# Patient Record
Sex: Female | Born: 1950
Health system: Southern US, Community
[De-identification: ages and names within clinical notes are randomized; demographics above are authoritative.]

## PROBLEM LIST (undated history)

## (undated) DIAGNOSIS — N390 Urinary tract infection, site not specified: Secondary | ICD-10-CM

## (undated) DIAGNOSIS — R519 Headache, unspecified: Secondary | ICD-10-CM

## (undated) DIAGNOSIS — I10 Essential (primary) hypertension: Secondary | ICD-10-CM

## (undated) DIAGNOSIS — E785 Hyperlipidemia, unspecified: Secondary | ICD-10-CM

## (undated) DIAGNOSIS — G8929 Other chronic pain: Secondary | ICD-10-CM

## (undated) DIAGNOSIS — E669 Obesity, unspecified: Secondary | ICD-10-CM

## (undated) DIAGNOSIS — E079 Disorder of thyroid, unspecified: Secondary | ICD-10-CM

## (undated) HISTORY — DX: Urinary tract infection, site not specified: N39.0

## (undated) HISTORY — DX: Essential (primary) hypertension: I10

## (undated) HISTORY — DX: Hyperlipidemia, unspecified: E78.5

## (undated) HISTORY — DX: Headache, unspecified: R51.9

## (undated) HISTORY — DX: Other chronic pain: G89.29

## (undated) HISTORY — DX: Disorder of thyroid, unspecified: E07.9

## (undated) HISTORY — PX: CHOLECYSTECTOMY: SHX55

## (undated) HISTORY — DX: Obesity, unspecified: E66.9

---

## 2002-11-13 ENCOUNTER — Encounter: Admission: RE | Admit: 2002-11-13 | Discharge: 2002-11-13 | Payer: Self-pay | Admitting: Obstetrics & Gynecology

## 2002-11-13 ENCOUNTER — Encounter: Payer: Self-pay | Admitting: Obstetrics & Gynecology

## 2003-01-17 ENCOUNTER — Other Ambulatory Visit: Admission: RE | Admit: 2003-01-17 | Discharge: 2003-01-17 | Payer: Self-pay | Admitting: Obstetrics & Gynecology

## 2005-10-13 ENCOUNTER — Encounter: Admission: RE | Admit: 2005-10-13 | Discharge: 2005-10-13 | Payer: Self-pay | Admitting: Obstetrics & Gynecology

## 2014-01-02 ENCOUNTER — Other Ambulatory Visit: Payer: Self-pay

## 2014-01-02 DIAGNOSIS — Z1231 Encounter for screening mammogram for malignant neoplasm of breast: Secondary | ICD-10-CM

## 2014-01-17 ENCOUNTER — Encounter (INDEPENDENT_AMBULATORY_CARE_PROVIDER_SITE_OTHER): Payer: Self-pay

## 2014-01-17 ENCOUNTER — Ambulatory Visit
Admission: RE | Admit: 2014-01-17 | Discharge: 2014-01-17 | Disposition: A | Discharge status: Home / Self Care | Payer: BC Managed Care – PPO | Source: Ambulatory Visit

## 2014-01-17 DIAGNOSIS — Z1231 Encounter for screening mammogram for malignant neoplasm of breast: Secondary | ICD-10-CM

## 2014-08-23 DIAGNOSIS — R32 Unspecified urinary incontinence: Secondary | ICD-10-CM | POA: Insufficient documentation

## 2014-08-31 DIAGNOSIS — D169 Benign neoplasm of bone and articular cartilage, unspecified: Secondary | ICD-10-CM | POA: Insufficient documentation

## 2015-01-31 ENCOUNTER — Other Ambulatory Visit: Payer: Self-pay

## 2015-01-31 DIAGNOSIS — Z1231 Encounter for screening mammogram for malignant neoplasm of breast: Secondary | ICD-10-CM

## 2015-02-27 ENCOUNTER — Ambulatory Visit
Admission: RE | Admit: 2015-02-27 | Discharge: 2015-02-27 | Disposition: A | Discharge status: Home / Self Care | Payer: BLUE CROSS/BLUE SHIELD | Source: Ambulatory Visit

## 2015-02-27 DIAGNOSIS — Z1231 Encounter for screening mammogram for malignant neoplasm of breast: Secondary | ICD-10-CM

## 2016-01-29 ENCOUNTER — Other Ambulatory Visit: Payer: Self-pay

## 2016-01-29 ENCOUNTER — Other Ambulatory Visit: Payer: Self-pay | Admitting: Family Medicine

## 2016-01-29 DIAGNOSIS — Z1231 Encounter for screening mammogram for malignant neoplasm of breast: Secondary | ICD-10-CM

## 2016-02-07 DIAGNOSIS — E66811 Obesity, class 1: Secondary | ICD-10-CM | POA: Insufficient documentation

## 2016-03-02 ENCOUNTER — Ambulatory Visit
Admission: RE | Admit: 2016-03-02 | Discharge: 2016-03-02 | Disposition: A | Discharge status: Home / Self Care | Payer: BLUE CROSS/BLUE SHIELD | Source: Ambulatory Visit

## 2016-03-02 DIAGNOSIS — Z1231 Encounter for screening mammogram for malignant neoplasm of breast: Secondary | ICD-10-CM

## 2017-03-05 ENCOUNTER — Other Ambulatory Visit: Payer: Self-pay | Admitting: Family Medicine

## 2017-03-05 DIAGNOSIS — Z1231 Encounter for screening mammogram for malignant neoplasm of breast: Secondary | ICD-10-CM

## 2017-04-06 ENCOUNTER — Ambulatory Visit: Payer: BLUE CROSS/BLUE SHIELD

## 2017-04-08 ENCOUNTER — Ambulatory Visit
Admission: RE | Admit: 2017-04-08 | Discharge: 2017-04-08 | Disposition: A | Discharge status: Home / Self Care | Payer: BLUE CROSS/BLUE SHIELD | Source: Ambulatory Visit | Attending: Family Medicine | Admitting: Family Medicine

## 2017-04-08 DIAGNOSIS — Z1231 Encounter for screening mammogram for malignant neoplasm of breast: Secondary | ICD-10-CM

## 2018-02-23 ENCOUNTER — Other Ambulatory Visit: Payer: Self-pay | Admitting: Family Medicine

## 2018-02-23 DIAGNOSIS — Z1231 Encounter for screening mammogram for malignant neoplasm of breast: Secondary | ICD-10-CM

## 2018-04-11 ENCOUNTER — Ambulatory Visit
Admission: RE | Admit: 2018-04-11 | Discharge: 2018-04-11 | Disposition: A | Discharge status: Home / Self Care | Payer: BLUE CROSS/BLUE SHIELD | Source: Ambulatory Visit | Attending: Family Medicine | Admitting: Family Medicine

## 2018-04-11 DIAGNOSIS — Z1231 Encounter for screening mammogram for malignant neoplasm of breast: Secondary | ICD-10-CM

## 2018-08-04 DIAGNOSIS — H25813 Combined forms of age-related cataract, bilateral: Secondary | ICD-10-CM | POA: Diagnosis not present

## 2018-08-04 DIAGNOSIS — H35372 Puckering of macula, left eye: Secondary | ICD-10-CM | POA: Diagnosis not present

## 2018-09-30 DIAGNOSIS — E039 Hypothyroidism, unspecified: Secondary | ICD-10-CM | POA: Diagnosis not present

## 2018-09-30 DIAGNOSIS — E669 Obesity, unspecified: Secondary | ICD-10-CM | POA: Diagnosis not present

## 2018-09-30 DIAGNOSIS — E663 Overweight: Secondary | ICD-10-CM | POA: Diagnosis not present

## 2018-10-20 DIAGNOSIS — E669 Obesity, unspecified: Secondary | ICD-10-CM | POA: Diagnosis not present

## 2018-10-20 DIAGNOSIS — I10 Essential (primary) hypertension: Secondary | ICD-10-CM | POA: Diagnosis not present

## 2018-10-20 DIAGNOSIS — Z6830 Body mass index (BMI) 30.0-30.9, adult: Secondary | ICD-10-CM | POA: Diagnosis not present

## 2019-03-31 ENCOUNTER — Other Ambulatory Visit: Payer: Self-pay | Admitting: *Deleted

## 2019-03-31 DIAGNOSIS — E6609 Other obesity due to excess calories: Secondary | ICD-10-CM | POA: Diagnosis not present

## 2019-03-31 DIAGNOSIS — Z1231 Encounter for screening mammogram for malignant neoplasm of breast: Secondary | ICD-10-CM

## 2019-03-31 DIAGNOSIS — R030 Elevated blood-pressure reading, without diagnosis of hypertension: Secondary | ICD-10-CM | POA: Diagnosis not present

## 2019-03-31 DIAGNOSIS — Z6832 Body mass index (BMI) 32.0-32.9, adult: Secondary | ICD-10-CM | POA: Diagnosis not present

## 2019-03-31 DIAGNOSIS — E039 Hypothyroidism, unspecified: Secondary | ICD-10-CM | POA: Diagnosis not present

## 2019-04-24 ENCOUNTER — Ambulatory Visit
Admission: RE | Admit: 2019-04-24 | Discharge: 2019-04-24 | Disposition: A | Discharge status: Home / Self Care | Payer: BC Managed Care – PPO | Source: Ambulatory Visit

## 2019-04-24 ENCOUNTER — Other Ambulatory Visit: Payer: Self-pay | Admitting: *Deleted

## 2019-04-24 ENCOUNTER — Other Ambulatory Visit: Payer: Self-pay

## 2019-04-24 DIAGNOSIS — Z1231 Encounter for screening mammogram for malignant neoplasm of breast: Secondary | ICD-10-CM

## 2019-05-04 DIAGNOSIS — D1801 Hemangioma of skin and subcutaneous tissue: Secondary | ICD-10-CM | POA: Diagnosis not present

## 2019-05-04 DIAGNOSIS — L814 Other melanin hyperpigmentation: Secondary | ICD-10-CM | POA: Diagnosis not present

## 2019-05-04 DIAGNOSIS — D225 Melanocytic nevi of trunk: Secondary | ICD-10-CM | POA: Diagnosis not present

## 2019-05-04 DIAGNOSIS — L821 Other seborrheic keratosis: Secondary | ICD-10-CM | POA: Diagnosis not present

## 2019-05-09 DIAGNOSIS — H5213 Myopia, bilateral: Secondary | ICD-10-CM | POA: Diagnosis not present

## 2019-06-15 DIAGNOSIS — Z01812 Encounter for preprocedural laboratory examination: Secondary | ICD-10-CM | POA: Diagnosis not present

## 2019-06-15 DIAGNOSIS — Z23 Encounter for immunization: Secondary | ICD-10-CM | POA: Diagnosis not present

## 2019-07-25 DIAGNOSIS — Z20828 Contact with and (suspected) exposure to other viral communicable diseases: Secondary | ICD-10-CM | POA: Diagnosis not present

## 2019-08-02 DIAGNOSIS — Z20828 Contact with and (suspected) exposure to other viral communicable diseases: Secondary | ICD-10-CM | POA: Diagnosis not present

## 2019-08-02 DIAGNOSIS — R519 Headache, unspecified: Secondary | ICD-10-CM | POA: Diagnosis not present

## 2019-08-02 DIAGNOSIS — M791 Myalgia, unspecified site: Secondary | ICD-10-CM | POA: Diagnosis not present

## 2019-08-03 DIAGNOSIS — J069 Acute upper respiratory infection, unspecified: Secondary | ICD-10-CM | POA: Diagnosis not present

## 2019-11-29 DIAGNOSIS — R030 Elevated blood-pressure reading, without diagnosis of hypertension: Secondary | ICD-10-CM | POA: Diagnosis not present

## 2019-11-29 DIAGNOSIS — E039 Hypothyroidism, unspecified: Secondary | ICD-10-CM | POA: Diagnosis not present

## 2019-11-29 DIAGNOSIS — Z6832 Body mass index (BMI) 32.0-32.9, adult: Secondary | ICD-10-CM | POA: Diagnosis not present

## 2019-11-29 DIAGNOSIS — E6609 Other obesity due to excess calories: Secondary | ICD-10-CM | POA: Diagnosis not present

## 2019-12-16 IMAGING — MG MM DIGITAL SCREENING BILAT W/ TOMO W/ CAD
8 series · 8 of 24 positions shown · non-contrast
Comparison: Previous exam(s).

CLINICAL DATA: Screening.

EXAM:
DIGITAL SCREENING BILATERAL MAMMOGRAM WITH TOMO AND CAD

[L CC synth-2D]
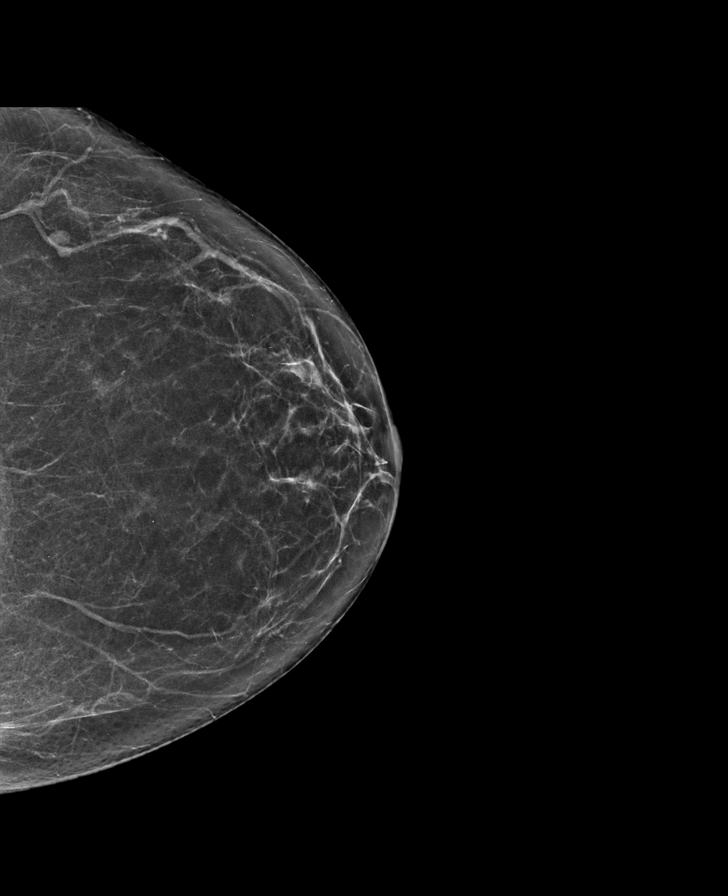

[R MLO synth-2D]
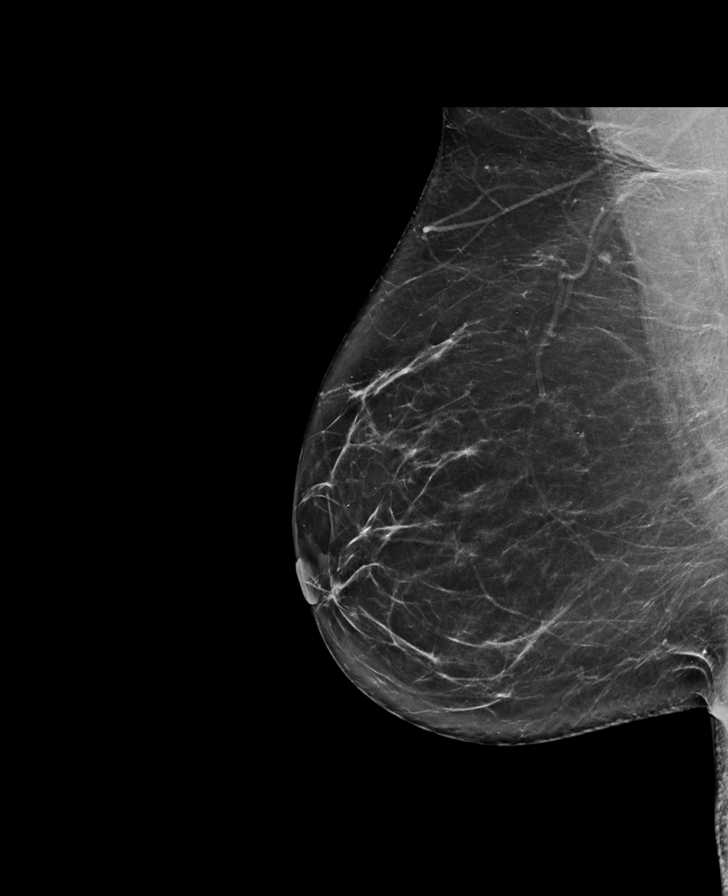

[L MLO synth-2D]
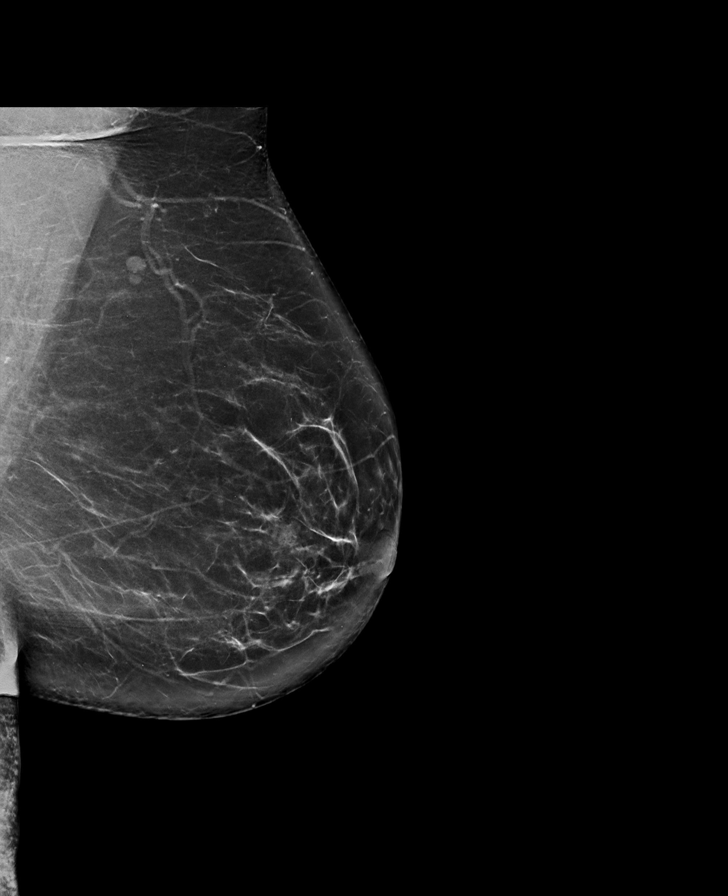

[R CC synth-2D]
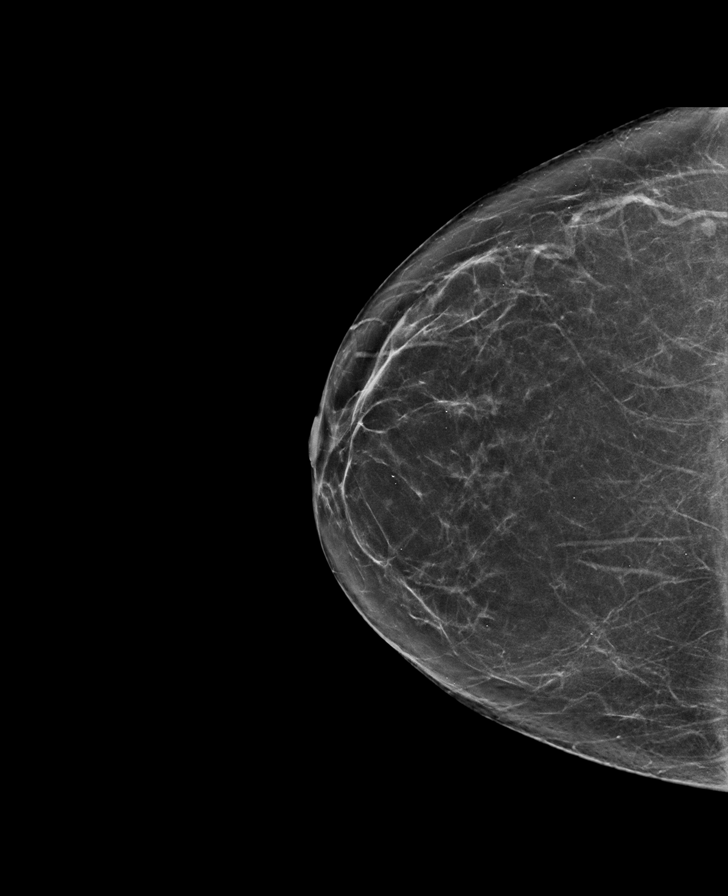

[R MLO tomo · tomo slice 41/81.0]
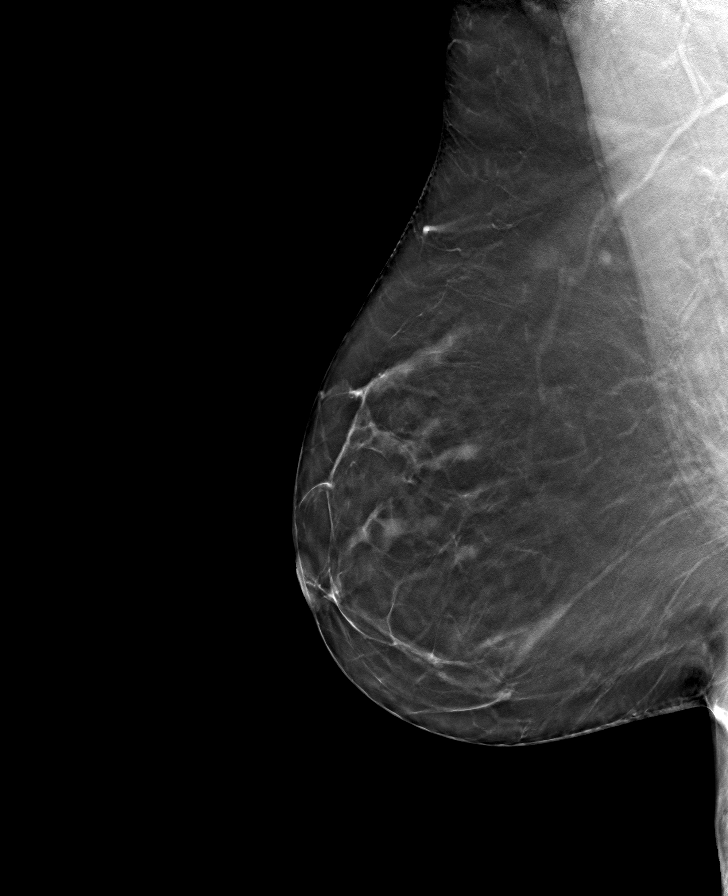

[R CC tomo · tomo slice 35/70.0]
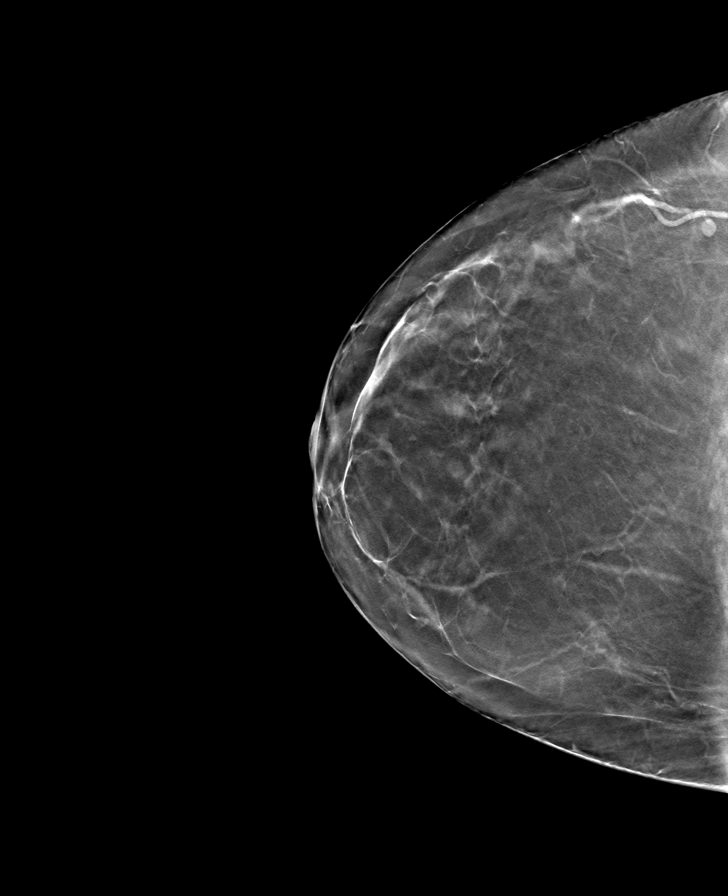

[L CC tomo · tomo slice 33/66.0]
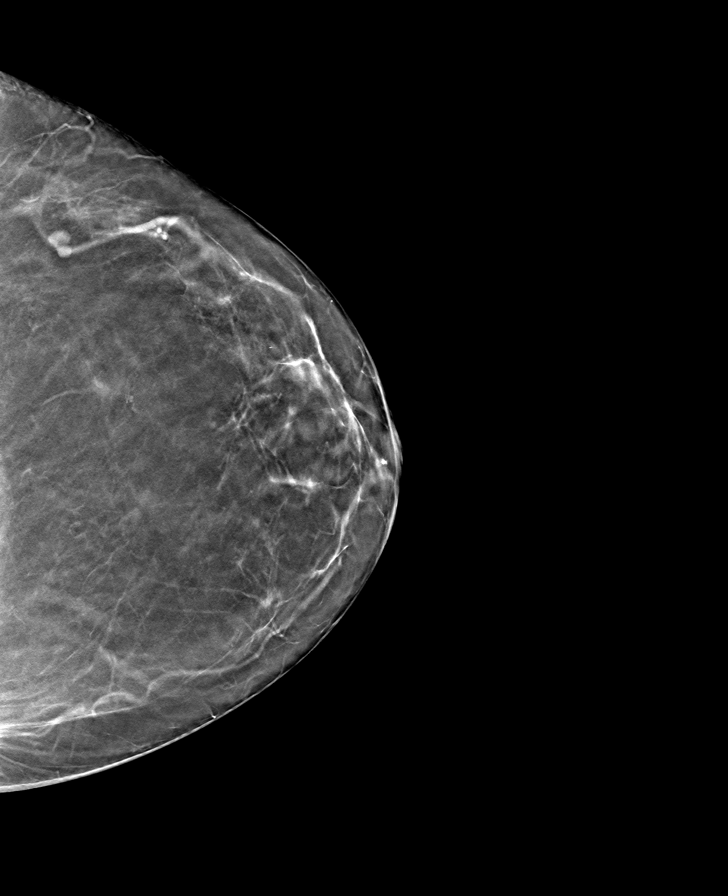

[L MLO tomo · tomo slice 43/84.0]
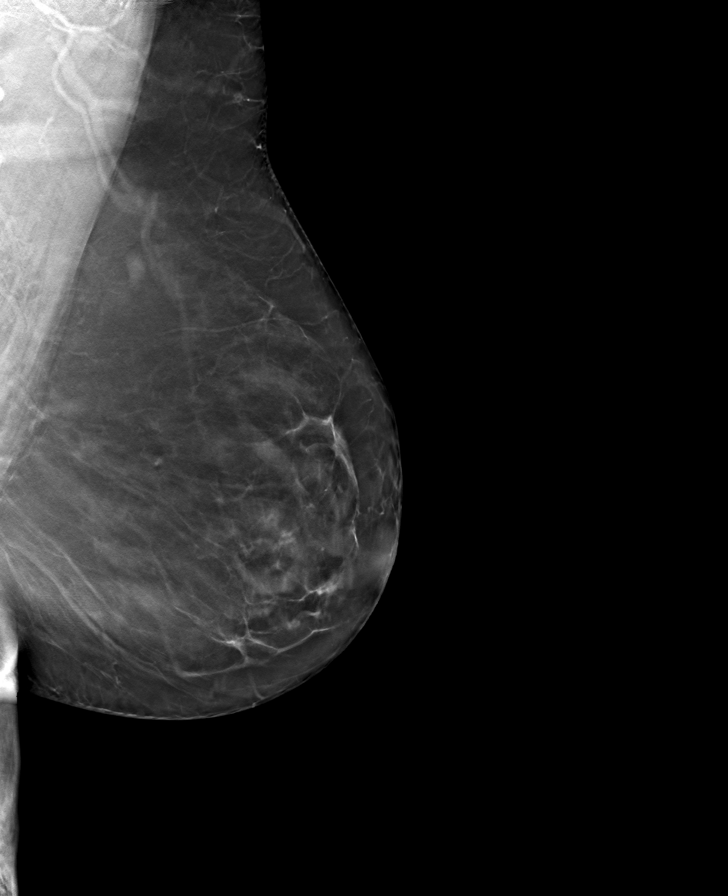

[8 of 24 positions shown; findings below may reference images not displayed]

ACR Breast Density Category b: There are scattered areas of
fibroglandular density.
FINDINGS: There are no findings suspicious for malignancy. Images were
processed with CAD.
IMPRESSION: No mammographic evidence of malignancy. A result letter of this
screening mammogram will be mailed directly to the patient.

RECOMMENDATION:
Screening mammogram in one year. (Code:[id number])

BI-RADS CATEGORY  1: Negative.

## 2019-12-25 DIAGNOSIS — H35372 Puckering of macula, left eye: Secondary | ICD-10-CM | POA: Diagnosis not present

## 2019-12-25 DIAGNOSIS — H25813 Combined forms of age-related cataract, bilateral: Secondary | ICD-10-CM | POA: Diagnosis not present

## 2020-01-02 DIAGNOSIS — H0288A Meibomian gland dysfunction right eye, upper and lower eyelids: Secondary | ICD-10-CM | POA: Diagnosis not present

## 2020-02-06 ENCOUNTER — Other Ambulatory Visit: Payer: Self-pay

## 2020-02-06 ENCOUNTER — Ambulatory Visit (INDEPENDENT_AMBULATORY_CARE_PROVIDER_SITE_OTHER): Payer: BC Managed Care – PPO

## 2020-02-06 DIAGNOSIS — Z111 Encounter for screening for respiratory tuberculosis: Secondary | ICD-10-CM

## 2020-02-08 ENCOUNTER — Other Ambulatory Visit: Payer: Self-pay

## 2020-02-08 ENCOUNTER — Ambulatory Visit: Payer: BC Managed Care – PPO

## 2020-02-08 DIAGNOSIS — Z111 Encounter for screening for respiratory tuberculosis: Secondary | ICD-10-CM

## 2020-02-08 DIAGNOSIS — Z23 Encounter for immunization: Secondary | ICD-10-CM

## 2020-02-08 LAB — TB SKIN TEST
Induration: 0 mm
TB Skin Test: NEGATIVE

## 2020-02-08 NOTE — Progress Notes (Signed)
Patient came in on 02/06/2020 for PPD placement

## 2020-02-09 NOTE — Progress Notes (Signed)
Patient came in for PPD reading on 02/08/2020.

## 2020-02-09 NOTE — Progress Notes (Signed)
° ° °  °  Mrs. Melanie Guerrero requested the BCG Vaccine.  She has read the information including potential side effects and signed the consent form.  She denies having any questions or concerns.    Vaccine was constituted using sterile water and administered on the left deltoid using a multiple puncture device.  Patient tolerated well.   Shelle Iron, LPN 80/16/55 37:48 AM

## 2020-02-09 NOTE — Addendum Note (Signed)
Addended by: Erie Noe on: 02/09/2020 10:52 AM   Modules accepted: Orders

## 2020-02-16 DIAGNOSIS — H25811 Combined forms of age-related cataract, right eye: Secondary | ICD-10-CM | POA: Diagnosis not present

## 2020-03-06 DIAGNOSIS — H52201 Unspecified astigmatism, right eye: Secondary | ICD-10-CM | POA: Diagnosis not present

## 2020-03-06 DIAGNOSIS — H2511 Age-related nuclear cataract, right eye: Secondary | ICD-10-CM | POA: Diagnosis not present

## 2020-03-06 DIAGNOSIS — H25811 Combined forms of age-related cataract, right eye: Secondary | ICD-10-CM | POA: Diagnosis not present

## 2020-03-06 DIAGNOSIS — H52221 Regular astigmatism, right eye: Secondary | ICD-10-CM | POA: Diagnosis not present

## 2020-03-14 DIAGNOSIS — H25812 Combined forms of age-related cataract, left eye: Secondary | ICD-10-CM | POA: Diagnosis not present

## 2020-03-14 DIAGNOSIS — H2512 Age-related nuclear cataract, left eye: Secondary | ICD-10-CM | POA: Diagnosis not present

## 2020-03-18 DIAGNOSIS — E6609 Other obesity due to excess calories: Secondary | ICD-10-CM | POA: Diagnosis not present

## 2020-03-18 DIAGNOSIS — E039 Hypothyroidism, unspecified: Secondary | ICD-10-CM | POA: Diagnosis not present

## 2020-03-18 DIAGNOSIS — Z6832 Body mass index (BMI) 32.0-32.9, adult: Secondary | ICD-10-CM | POA: Diagnosis not present

## 2020-03-18 DIAGNOSIS — R030 Elevated blood-pressure reading, without diagnosis of hypertension: Secondary | ICD-10-CM | POA: Diagnosis not present

## 2020-04-09 DIAGNOSIS — J101 Influenza due to other identified influenza virus with other respiratory manifestations: Secondary | ICD-10-CM | POA: Diagnosis not present

## 2020-04-09 DIAGNOSIS — B974 Respiratory syncytial virus as the cause of diseases classified elsewhere: Secondary | ICD-10-CM | POA: Diagnosis not present

## 2020-04-09 DIAGNOSIS — U071 COVID-19: Secondary | ICD-10-CM | POA: Diagnosis not present

## 2020-04-09 DIAGNOSIS — Z20828 Contact with and (suspected) exposure to other viral communicable diseases: Secondary | ICD-10-CM | POA: Diagnosis not present

## 2020-04-18 DIAGNOSIS — B974 Respiratory syncytial virus as the cause of diseases classified elsewhere: Secondary | ICD-10-CM | POA: Diagnosis not present

## 2020-04-18 DIAGNOSIS — U071 COVID-19: Secondary | ICD-10-CM | POA: Diagnosis not present

## 2020-04-18 DIAGNOSIS — Z20828 Contact with and (suspected) exposure to other viral communicable diseases: Secondary | ICD-10-CM | POA: Diagnosis not present

## 2020-04-18 DIAGNOSIS — J101 Influenza due to other identified influenza virus with other respiratory manifestations: Secondary | ICD-10-CM | POA: Diagnosis not present

## 2020-04-24 ENCOUNTER — Other Ambulatory Visit: Payer: Self-pay

## 2020-04-24 ENCOUNTER — Ambulatory Visit
Admission: RE | Admit: 2020-04-24 | Discharge: 2020-04-24 | Disposition: A | Discharge status: Home / Self Care | Payer: BC Managed Care – PPO | Source: Ambulatory Visit | Attending: Family Medicine | Admitting: Family Medicine

## 2020-04-24 DIAGNOSIS — Z1231 Encounter for screening mammogram for malignant neoplasm of breast: Secondary | ICD-10-CM | POA: Diagnosis not present

## 2020-05-07 DIAGNOSIS — D692 Other nonthrombocytopenic purpura: Secondary | ICD-10-CM | POA: Diagnosis not present

## 2020-05-07 DIAGNOSIS — L821 Other seborrheic keratosis: Secondary | ICD-10-CM | POA: Diagnosis not present

## 2020-05-07 DIAGNOSIS — L814 Other melanin hyperpigmentation: Secondary | ICD-10-CM | POA: Diagnosis not present

## 2020-05-07 DIAGNOSIS — D225 Melanocytic nevi of trunk: Secondary | ICD-10-CM | POA: Diagnosis not present

## 2020-06-05 DIAGNOSIS — H35373 Puckering of macula, bilateral: Secondary | ICD-10-CM | POA: Diagnosis not present

## 2020-09-13 ENCOUNTER — Other Ambulatory Visit: Payer: Self-pay

## 2020-09-13 ENCOUNTER — Other Ambulatory Visit: Payer: Self-pay | Admitting: Family Medicine

## 2020-09-13 ENCOUNTER — Encounter: Payer: Self-pay | Admitting: Family Medicine

## 2020-09-13 ENCOUNTER — Ambulatory Visit (INDEPENDENT_AMBULATORY_CARE_PROVIDER_SITE_OTHER): Payer: Medicare Other | Admitting: Family Medicine

## 2020-09-13 VITALS — BP 160/80 | HR 103 | Temp 98.7°F | Wt 205.0 lb

## 2020-09-13 DIAGNOSIS — R1013 Epigastric pain: Secondary | ICD-10-CM | POA: Diagnosis not present

## 2020-09-13 DIAGNOSIS — R10816 Epigastric abdominal tenderness: Secondary | ICD-10-CM

## 2020-09-13 DIAGNOSIS — I10 Essential (primary) hypertension: Secondary | ICD-10-CM | POA: Diagnosis not present

## 2020-09-13 DIAGNOSIS — Z1211 Encounter for screening for malignant neoplasm of colon: Secondary | ICD-10-CM

## 2020-09-13 MED ORDER — LOSARTAN POTASSIUM-HCTZ 50-12.5 MG PO TABS: 1.0000 | ORAL_TABLET | Freq: Every day | ORAL | 0 refills | Status: DC

## 2020-09-13 MED ORDER — OMEPRAZOLE 40 MG PO CPDR: 40.0000 mg | DELAYED_RELEASE_CAPSULE | Freq: Two times a day (BID) | ORAL | 0 refills | Status: DC

## 2020-09-13 NOTE — Progress Notes (Signed)
Acute Office Visit  Subjective:    Patient ID: Melanie Guerrero, female    DOB: [birth year], 70 y.o.   MRN: [id number]  Chief Complaint  Patient presents with  . Abdominal Pain  . Hypertension    HPI Patient is in today for epigastric and right upper quadrant abdominal pain that began earlier this morning.  It does not awaken her from sleep.  Radiates up to left shoulder. No associated sob,n,v. Pain waxes and wanes. The patient is not nauseated, vomiting, having diarrhea, or having constipation.  She denies fevers, chills or sweats.  She did take her blood pressure while I was on the phone with her initially and her blood pressure was 180/116, pulse 79.  Patient has a history of hypertension however she has been to control it with diet and exercise.  She has not actually checked her blood pressure for 3 months but prior to this it was ranging in the 120s over 80s she reports.  She denies any musculoskeletal injuries.  She rates her pain 8 out of 10 when she stands up or straightens her body out.  it gets better when she is more bent over or in the fetal position.  She did try to take some Tums which really did not help.  She is concerned it feels like gas but was also worried about atypical chest pain.  Pt has never had a colonoscopy.   Past Medical History:  Diagnosis Date  . Hyperlipidemia   . Hypertension   . Thyroid disease    Hypothyroidism      Past Surgical History:  Procedure Laterality Date  . CHOLECYSTECTOMY      Family History  Problem Relation Age of Onset  . Dementia Mother   . Breast cancer Mother 76  . Hypothyroidism Sister     Social History   Socioeconomic History  . Marital status: Married    Spouse name: Not on file  . Number of children: Not on file  . Years of education: Not on file  . Highest education level: Not on file  Occupational History  . Not on file  Tobacco Use  . Smoking status: Never Smoker  . Smokeless tobacco: Never Used  Substance  and Sexual Activity  . Alcohol use: Yes    Alcohol/week: 3.0 standard drinks    Types: 3 Glasses of wine per week  . Drug use: Not Currently  . Sexual activity: Not on file  Other Topics Concern  . Not on file  Social History Narrative  . Not on file   Social Determinants of Health   Financial Resource Strain: Not on file  Food Insecurity: Not on file  Transportation Needs: Not on file  Physical Activity: Not on file  Stress: Not on file  Social Connections: Not on file  Intimate Partner Violence: Not on file    Outpatient Medications Prior to Visit  Medication Sig Dispense Refill  . levothyroxine (SYNTHROID) 150 MCG tablet TAKE 1 TABLET BY MOUTH DAILY EXCEPT 1/2 TABLET ON SATURDAY AND SUNDAY    . liothyronine (CYTOMEL) 5 MCG tablet Take 1 tablet by mouth daily.    Marland Kitchen losartan-hydrochlorothiazide (HYZAAR) 50-12.5 MG tablet Take 1 tablet by mouth daily. 30 tablet 0  . omeprazole (PRILOSEC) 40 MG capsule Take 1 capsule (40 mg total) by mouth 2 (two) times daily. 60 capsule 0  . tretinoin (RETIN-A) 0.05 % cream APPLY 1 APPLICATION ON THE SKIN NIGHTLY     No facility-administered medications prior to  visit.    Allergies  Allergen Reactions  . Onion Other (See Comments)    headaches    Review of Systems  Constitutional: Negative for chills, fatigue and fever.  HENT: Negative for congestion, ear pain and sore throat.   Respiratory: Negative for cough and shortness of breath.   Cardiovascular: Negative for chest pain.  Gastrointestinal: Positive for abdominal pain. Negative for constipation, diarrhea, nausea and vomiting.  Genitourinary: Negative for dysuria and urgency.  Neurological: Negative for headaches.       Objective:    Physical Exam Vitals reviewed.  Constitutional:      Appearance: Normal appearance.  Neck:     Vascular: No carotid bruit.  Cardiovascular:     Rate and Rhythm: Normal rate and regular rhythm.     Pulses: Normal pulses.     Heart sounds:  Normal heart sounds.  Pulmonary:     Effort: Pulmonary effort is normal. No respiratory distress.     Breath sounds: Normal breath sounds.  Abdominal:     General: Abdomen is flat. Bowel sounds are normal.     Palpations: Abdomen is soft.     Tenderness: There is abdominal tenderness in the epigastric area and left upper quadrant. There is no right CVA tenderness, left CVA tenderness, guarding or rebound.  Neurological:     Mental Status: She is alert and oriented to person, place, and time.  Psychiatric:        Mood and Affect: Mood normal.        Behavior: Behavior normal.     BP (!) 160/80 Comment: 15 minutes after clonidine 0.1 mg given.  Pulse (!) 103   Temp 98.7 F (37.1 C)   Wt 205 lb (93 kg)   SpO2 97%  Wt Readings from Last 3 Encounters:  09/13/20 205 lb (93 kg)  Patient was given clonidine 0.1 mg in the office.  Her blood pressure improved down to 160/90. Patient was given GI cocktail.  After about 15 minutes this did ease her pain but not fully resolved.Marland Kitchen  Health Maintenance Due  Topic Date Due  . Hepatitis C Screening  Never done  . COVID-19 Vaccine (1) Never done  . COLONOSCOPY (Pts 45-25yr Insurance coverage will need to be confirmed)  Never done  . DEXA SCAN  Never done  . PNA vac Low Risk Adult (1 of 2 - PCV13) Never done  . INFLUENZA VACCINE  Never done    There are no preventive care reminders to display for this patient.   No results found for: TSH No results found for: WBC, HGB, HCT, MCV, PLT No results found for: NA, K, CHLORIDE, CO2, GLUCOSE, BUN, CREATININE, BILITOT, ALKPHOS, AST, ALT, PROT, ALBUMIN, CALCIUM, ANIONGAP, EGFR, GFR No results found for: CHOL No results found for: HDL No results found for: LDLCALC No results found for: TRIG No results found for: CHOLHDL No results found for: HGBA1C     Assessment & Plan:  1. Epigastric pain Given GI cocktail in office. Some improvement in abdominal pain. -CBC, CMP, H. pylori serum  ordered -Start omeprazole 40 mg twice daily. -Mylanta per instructions on the bottle. -EKG: Normal sinus rhythm.  No ST changes  2. Primary hypertension Medications administered in the office. Clonidine 0.1 mg. BP improved, but not normalized.  Start on losartan/HCTZ 50/12.5 mg once daily. EKG: Normal sinus rhythm.  No ST changes  I spent 40 minutes dedicated to the care of this patient on the date of this encounter to include  face-to-face time with the patient, as well as: Perform vital signs, EKG, administration of medications, as I was without a nurse..  Reviewed history and had discussions on the phone twice on the same day of service with the patient. Also had to abstract chart.   Follow-up: No follow-ups on file.  An After Visit Summary was printed and given to the patient.  Rochel Brome, MD [facility] [phone]

## 2020-09-13 NOTE — Patient Instructions (Addendum)
Abdominal pain is concerning for gastritis or an ulcer.  Prescription for omeprazole 40 mg twice daily.  May get some Mylanta to take until the omeprazole begins to work.  Hypertension: Losartan/HCTZ 50/12.5 mg once daily.

## 2020-09-16 ENCOUNTER — Telehealth: Payer: Self-pay

## 2020-09-16 LAB — CBC WITH DIFFERENTIAL/PLATELET
Basophils Absolute: 0.1 10*3/uL (ref 0.0–0.2)
Basos: 1 %
EOS (ABSOLUTE): 0.2 10*3/uL (ref 0.0–0.4)
Eos: 2 %
Hematocrit: 42.1 % (ref 34.0–46.6)
Hemoglobin: 14.4 g/dL (ref 11.1–15.9)
Immature Grans (Abs): 0 10*3/uL (ref 0.0–0.1)
Immature Granulocytes: 0 %
Lymphocytes Absolute: 2.3 10*3/uL (ref 0.7–3.1)
Lymphs: 18 %
MCH: 32.2 pg (ref 26.6–33.0)
MCHC: 34.2 g/dL (ref 31.5–35.7)
MCV: 94 fL (ref 79–97)
Monocytes Absolute: 0.7 10*3/uL (ref 0.1–0.9)
Monocytes: 5 %
Neutrophils Absolute: 9.4 10*3/uL — ABNORMAL HIGH (ref 1.4–7.0)
Neutrophils: 74 %
Platelets: 272 10*3/uL (ref 150–450)
RBC: 4.47 x10E6/uL (ref 3.77–5.28)
RDW: 12.5 % (ref 11.7–15.4)
WBC: 12.8 10*3/uL — ABNORMAL HIGH (ref 3.4–10.8)

## 2020-09-16 LAB — COMPREHENSIVE METABOLIC PANEL
ALT: 13 IU/L (ref 0–32)
AST: 14 IU/L (ref 0–40)
Albumin/Globulin Ratio: 1.3 (ref 1.2–2.2)
Albumin: 4.1 g/dL (ref 3.8–4.8)
Alkaline Phosphatase: 84 IU/L (ref 44–121)
BUN/Creatinine Ratio: 20 (ref 12–28)
BUN: 19 mg/dL (ref 8–27)
Bilirubin Total: 0.4 mg/dL (ref 0.0–1.2)
CO2: 26 mmol/L (ref 20–29)
Calcium: 9.6 mg/dL (ref 8.7–10.3)
Chloride: 100 mmol/L (ref 96–106)
Creatinine, Ser: 0.95 mg/dL (ref 0.57–1.00)
GFR calc Af Amer: 71 mL/min/{1.73_m2} (ref >59)
GFR calc non Af Amer: 62 mL/min/{1.73_m2} (ref >59)
Globulin, Total: 3.2 g/dL (ref 1.5–4.5)
Glucose: 105 mg/dL — ABNORMAL HIGH (ref 65–99)
Potassium: 4.2 mmol/L (ref 3.5–5.2)
Sodium: 139 mmol/L (ref 134–144)
Total Protein: 7.3 g/dL (ref 6.0–8.5)

## 2020-09-16 LAB — HELICOBACTER PYLORI ABS-IGG+IGA, BLD
H. pylori, IgA Abs: <9 units (ref 0.0–8.9)
H. pylori, IgG AbS: 0.45 Index Value (ref 0.00–0.79)

## 2020-09-16 NOTE — Telephone Encounter (Signed)
Recommend continue current bp medicine. Give it more time.

## 2020-09-16 NOTE — Telephone Encounter (Signed)
Patient went to [facility] in [place] last Friday and she wants to know if you have seen the lab results yet? Her blood pressure went down 139/80's, but today her blood pressure went up again 150-60/90's.

## 2020-09-16 NOTE — Telephone Encounter (Signed)
Melanie Guerrero called for lab results that were drawn on Friday at [facility] on [address].  She was notified that her WBC is elevated slightly per Dr. Tobie Poet and her kidney function is normal.  Her h. Pylori test is pending.  She reports the pain has improved with the omeprazole.  She denies fever or chills.  Her bp is 139/79 - 161/90, pulse 100.

## 2020-09-17 ENCOUNTER — Encounter: Payer: Self-pay | Admitting: Family Medicine

## 2020-09-27 ENCOUNTER — Ambulatory Visit (INDEPENDENT_AMBULATORY_CARE_PROVIDER_SITE_OTHER): Payer: Medicare Other | Admitting: Gastroenterology

## 2020-09-27 ENCOUNTER — Encounter: Payer: Self-pay | Admitting: Gastroenterology

## 2020-09-27 VITALS — BP 140/92 | HR 97 | Ht 67.0 in | Wt 205.5 lb

## 2020-09-27 DIAGNOSIS — R1013 Epigastric pain: Secondary | ICD-10-CM | POA: Diagnosis not present

## 2020-09-27 DIAGNOSIS — Z1211 Encounter for screening for malignant neoplasm of colon: Secondary | ICD-10-CM | POA: Diagnosis not present

## 2020-09-27 NOTE — Patient Instructions (Addendum)
If you are age 69 or older, your body mass index should be between 23-30. Your Body mass index is 32.19 kg/m. If this is out of the aforementioned range listed, please consider follow up with your Primary Care Provider.  If you are age 43 or younger, your body mass index should be between 19-25. Your Body mass index is 32.19 kg/m. If this is out of the aformentioned range listed, please consider follow up with your Primary Care Provider.   You have been scheduled for an endoscopy and colonoscopy. Please follow the written instructions given to you at your visit today. Please pick up your prep supplies at the pharmacy within the next 1-3 days. If you use inhalers (even only as needed), please bring them with you on the day of your procedure.   Continue Omeprazole twice daily for 2 weeks, then once daily.   Thank you,  Dr. Jackquline Denmark

## 2020-09-27 NOTE — Progress Notes (Signed)
Chief Complaint:   Referring Provider:  Rochel Brome, MD      ASSESSMENT AND PLAN;   #1. Epi pain (better). S/P lap chole. Nl CBC, CMP  #2.  Colorectal cancer screening   Plan: -Proceed with EGD/colon. Discussed risks & benefits. Risks including rare perforation req laparotomy, bleeding after bx/polypectomy req blood transfusion, rarely missing neoplasms, risks of anesthesia/sedation. Benefits outweigh the risks. Patient agrees to proceed. All the questions were answered. Consent forms given for review. -Continue omeprazole 40 bid for now, then reduce it to QD in 2 weeks.      HPI:    Melanie Guerrero is a 70 y.o. female  With HTN, HLD, obesity, chronic headaches, S/P cholecystectomy.  C/O Epi pain x 2 episodes, 2 weeks ago- woke her up, seen by Dr. Tobie Poet.  Her blood pressure was elevated.  She underwent cardiac evaluation which included EKG which was negative.  It has been recommended to proceed with GI evaluation.  She describes this as mainly epigastric discomfort, radiating to the left shoulder.  There is no associated nausea, vomiting.  She has heartburn.  She was started on omeprazole 40 mg p.o. twice daily with good relief.  She also takes Tums on regular basis.  No definite food intolerance.  No sodas, chocolates, chewing gums, artificial sweeteners and candy. No NSAIDs  She denies having any odynophagia or dysphagia.  She denies having any diarrhea or constipation.  She is also been advised to get colonoscopy for colorectal cancer screening.  SH- son in [place], [place] child.  She is going there for about a week.  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 Past Medical History:  Diagnosis Date  . Chronic headaches   . Hyperlipidemia   . Hypertension   . Obesity   . Thyroid disease    Hypothyroidism  . UTI (urinary tract infection)     Past Surgical History:  Procedure Laterality Date  . CHOLECYSTECTOMY    . EYE SURGERY  02/2020   cataracts BL.     Family  History  Problem Relation Age of Onset  . Dementia Mother   . Breast cancer Mother 58  . Hypothyroidism Sister   . Colon cancer Neg Hx   . Esophageal cancer Neg Hx     Social History   Tobacco Use  . Smoking status: Never Smoker  . Smokeless tobacco: Never Used  Vaping Use  . Vaping Use: Never used  Substance Use Topics  . Alcohol use: Yes    Alcohol/week: 3.0 standard drinks    Types: 3 Glasses of wine per week  . Drug use: Not Currently    Current Outpatient Medications  Medication Sig Dispense Refill  . AMBULATORY NON FORMULARY MEDICATION Medication Name: Superbeets- powder mix she mixes with water    . B Complex Vitamins (VITAMIN B COMPLEX PO) Take by mouth.    . Cholecalciferol (VITAMIN D3 PO) Take by mouth.    . ELDERBERRY PO Take by mouth.    . levothyroxine (SYNTHROID) 150 MCG tablet TAKE 1 TABLET BY MOUTH DAILY EXCEPT 1/2 TABLET ON SATURDAY AND SUNDAY    . liothyronine (CYTOMEL) 5 MCG tablet Take 1 tablet by mouth daily.    Marland Kitchen losartan-hydrochlorothiazide (HYZAAR) 50-12.5 MG tablet Take 1 tablet by mouth daily. 30 tablet 0  . Multiple Vitamin (MULTIVITAMIN) tablet Take 1 tablet by mouth daily.    . Omega-3 Fatty Acids (FISH OIL PO) Take by mouth.    Marland Kitchen omeprazole (PRILOSEC)  40 MG capsule Take 1 capsule (40 mg total) by mouth 2 (two) times daily. 60 capsule 0   No current facility-administered medications for this visit.    Allergies  Allergen Reactions  . Onion Other (See Comments)    headaches    Review of Systems:  Constitutional: Denies fever, chills, diaphoresis, appetite change and fatigue.  HEENT: Denies photophobia, eye pain, redness, hearing loss, ear pain, congestion, sore throat, rhinorrhea, sneezing, mouth sores, neck pain, neck stiffness and tinnitus.   Respiratory: Denies SOB, DOE, cough, chest tightness,  and wheezing.   Cardiovascular: Denies chest pain, palpitations and leg swelling.  Genitourinary: Denies dysuria, urgency, frequency,  hematuria, flank pain and difficulty urinating.  Musculoskeletal: Denies myalgias, back pain, joint swelling, arthralgias and gait problem.  Skin: No rash.  Neurological: Denies dizziness, seizures, syncope, weakness, light-headedness, numbness and headaches.  Hematological: Denies adenopathy. Easy bruising, personal or family bleeding history  Psychiatric/Behavioral: No anxiety or depression     Physical Exam:    Ht 5\' 7"  (1.702 m)   Wt 205 lb 8 oz (93.2 kg)   BMI 32.19 kg/m  Wt Readings from Last 3 Encounters:  09/27/20 205 lb 8 oz (93.2 kg)  09/13/20 205 lb (93 kg)   Constitutional:  Well-developed, in no acute distress. Psychiatric: Normal mood and affect. Behavior is normal. HEENT: Pupils normal.  Conjunctivae are normal. No scleral icterus. Neck supple.  Cardiovascular: Normal rate, regular rhythm. No edema Pulmonary/chest: Effort normal and breath sounds normal. No wheezing, rales or rhonchi. Abdominal: Soft, nondistended. Nontender. Bowel sounds active throughout. There are no masses palpable. No hepatomegaly. Rectal: Deferred Neurological: Alert and oriented to person place and time. Skin: Skin is warm and dry. No rashes noted.  Data Reviewed: I have personally reviewed following labs and imaging studies  CBC: CBC Latest Ref Rng & Units 09/13/2020  WBC 3.4 - 10.8 x10E3/uL 12.8(H)  Hemoglobin 11.1 - 15.9 g/dL 14.4  Hematocrit 34.0 - 46.6 % 42.1  Platelets 150 - 450 x10E3/uL 272    CMP: CMP Latest Ref Rng & Units 09/13/2020  Glucose 65 - 99 mg/dL 105(H)  BUN 8 - 27 mg/dL 19  Creatinine 0.57 - 1.00 mg/dL 0.95  Sodium 134 - 144 mmol/L 139  Potassium 3.5 - 5.2 mmol/L 4.2  Chloride 96 - 106 mmol/L 100  CO2 20 - 29 mmol/L 26  Calcium 8.7 - 10.3 mg/dL 9.6  Total Protein 6.0 - 8.5 g/dL 7.3  Total Bilirubin 0.0 - 1.2 mg/dL 0.4  Alkaline Phos 44 - 121 IU/L 84  AST 0 - 40 IU/L 14  ALT 0 - 32 IU/L 13      Carmell Austria, MD 09/27/2020, 10:30 AM  Cc: Rochel Brome,  MD

## 2020-09-30 ENCOUNTER — Telehealth: Payer: Self-pay | Admitting: Gastroenterology

## 2020-09-30 MED ORDER — OMEPRAZOLE 40 MG PO CPDR: 40.0000 mg | DELAYED_RELEASE_CAPSULE | Freq: Every day | ORAL | 11 refills | Status: DC

## 2020-09-30 NOTE — Telephone Encounter (Signed)
I have sent prescription to patient's pharmacy.  

## 2020-09-30 NOTE — Telephone Encounter (Signed)
Pt called requesting prescription for Omeprazole 40mg . She stated that her PCP prescribed it once and Dr. Lyndel Safe wanted pt to keep taking it. Her pharmacy is [facility] in [place].

## 2020-10-02 ENCOUNTER — Ambulatory Visit (INDEPENDENT_AMBULATORY_CARE_PROVIDER_SITE_OTHER): Payer: Medicare Other

## 2020-10-02 ENCOUNTER — Other Ambulatory Visit: Payer: Self-pay

## 2020-10-02 VITALS — BP 138/72

## 2020-10-02 DIAGNOSIS — Z23 Encounter for immunization: Secondary | ICD-10-CM | POA: Diagnosis not present

## 2020-10-02 NOTE — Progress Notes (Signed)
Patient came in office for BP check and flu vaccine. Pt's BP was 138/72. Pt tolerated flu vaccine. Received in L deltoid.

## 2020-10-05 ENCOUNTER — Other Ambulatory Visit: Payer: Self-pay | Admitting: Family Medicine

## 2020-10-05 DIAGNOSIS — R10816 Epigastric abdominal tenderness: Secondary | ICD-10-CM

## 2020-11-12 ENCOUNTER — Encounter: Payer: Self-pay | Admitting: Gastroenterology

## 2020-11-12 ENCOUNTER — Other Ambulatory Visit: Payer: Self-pay

## 2020-11-12 ENCOUNTER — Ambulatory Visit (AMBULATORY_SURGERY_CENTER): Payer: Medicare Other | Admitting: Gastroenterology

## 2020-11-12 VITALS — BP 141/76 | HR 65 | Temp 97.5°F | Resp 16 | Ht 67.0 in | Wt 205.0 lb

## 2020-11-12 DIAGNOSIS — K3189 Other diseases of stomach and duodenum: Secondary | ICD-10-CM

## 2020-11-12 DIAGNOSIS — R1013 Epigastric pain: Secondary | ICD-10-CM

## 2020-11-12 DIAGNOSIS — K2970 Gastritis, unspecified, without bleeding: Secondary | ICD-10-CM | POA: Diagnosis not present

## 2020-11-12 DIAGNOSIS — Z1211 Encounter for screening for malignant neoplasm of colon: Secondary | ICD-10-CM

## 2020-11-12 DIAGNOSIS — D124 Benign neoplasm of descending colon: Secondary | ICD-10-CM | POA: Diagnosis not present

## 2020-11-12 DIAGNOSIS — K317 Polyp of stomach and duodenum: Secondary | ICD-10-CM | POA: Diagnosis not present

## 2020-11-12 MED ORDER — SODIUM CHLORIDE 0.9 % IV SOLN: 500.0000 mL | Freq: Once | INTRAVENOUS | Status: DC

## 2020-11-12 NOTE — Op Note (Signed)
[facility] Patient Name: Melanie Guerrero Procedure Date: 11/12/2020 3:12 PM MRN: [id number] Endoscopist: Jackquline Denmark , MD Age: 70 Referring MD:  Date of Birth: Jan 25, 1951 Gender: Female Account #: [id number] Procedure:                Upper GI endoscopy Indications:              Epigastric abdominal pain Medicines:                Monitored Anesthesia Care Procedure:                Pre-Anesthesia Assessment:                           - Prior to the procedure, a History and Physical                            was performed, and patient medications and                            allergies were reviewed. The patient's tolerance of                            previous anesthesia was also reviewed. The risks                            and benefits of the procedure and the sedation                            options and risks were discussed with the patient.                            All questions were answered, and informed consent                            was obtained. Prior Anticoagulants: The patient has                            taken no previous anticoagulant or antiplatelet                            agents. ASA Grade Assessment: II - A patient with                            mild systemic disease. After reviewing the risks                            and benefits, the patient was deemed in                            satisfactory condition to undergo the procedure.                           After obtaining informed consent, the endoscope was  passed under direct vision. Throughout the                            procedure, the patient's blood pressure, pulse, and                            oxygen saturations were monitored continuously. The                            Endoscope was introduced through the mouth, and                            advanced to the second part of duodenum. The upper                            GI endoscopy was accomplished  without difficulty.                            The patient tolerated the procedure well. Scope In: Scope Out: Findings:                 The examined esophagus was normal with well defined                            z-line at 36 cm.                           Localized minimal inflammation characterized by                            erythema was found in the gastric antrum. Biopsies                            were taken with a cold forceps for histology.                           Multiple (20-25) 2 to 8 mm semi-sessile polyps with                            no bleeding and no stigmata of recent bleeding were                            found in the gastric fundus and in the gastric                            body. 4 polyps were removed with a hot snare.                            Resection and retrieval were complete.                           The examined duodenum was normal. Biopsies for  histology were taken with a cold forceps for                            evaluation of celiac disease.                           There was a small lipoma, 8 mm in diameter, in                            duodenum D1/D2 jn (yellowish submucosal lesion) Complications:            No immediate complications. Estimated Blood Loss:     Estimated blood loss: none. Impression:               - Mild Gastritis. Biopsied.                           - Multiple gastric polyps. (Resected and retrieved                            x 4).                           - Incidental duodenal lipoma. Recommendation:           - Patient has a contact number available for                            emergencies. The signs and symptoms of potential                            delayed complications were discussed with the                            patient. Return to normal activities tomorrow.                            Written discharge instructions were provided to the                            patient.                            - Resume previous diet.                           - Continue present medications. Can reduce                            omeprezole to QOD x 2 weeks, then use it PRN                           - No aspirin, ibuprofen, naproxen, or other                            non-steroidal anti-inflammatory drugs for 5 days  after polyp removal.                           - Await pathology results.                           - The findings and recommendations were discussed                            with the patient's family. Jackquline Denmark, MD 11/12/2020 4:05:52 PM This report has been signed electronically.

## 2020-11-12 NOTE — Patient Instructions (Signed)
Please read handouts provided. Continue present medications. Can reduce omeprazole to every other day for 2 weeks, then use it as needed. Await pathology results. No aspirin, ibuprofen, naproxen, or other non-steriodal anti-inflammatory drugs for 5 days.     YOU HAD AN ENDOSCOPIC PROCEDURE TODAY AT [facility]:   Refer to the procedure report that was given to you for any specific questions about what was found during the examination.  If the procedure report does not answer your questions, please call your gastroenterologist to clarify.  If you requested that your care partner not be given the details of your procedure findings, then the procedure report has been included in a sealed envelope for you to review at your convenience later.  YOU SHOULD EXPECT: Some feelings of bloating in the abdomen. Passage of more gas than usual.  Walking can help get rid of the air that was put into your GI tract during the procedure and reduce the bloating. If you had a lower endoscopy (such as a colonoscopy or flexible sigmoidoscopy) you may notice spotting of blood in your stool or on the toilet paper. If you underwent a bowel prep for your procedure, you may not have a normal bowel movement for a few days.  Please Note:  You might notice some irritation and congestion in your nose or some drainage.  This is from the oxygen used during your procedure.  There is no need for concern and it should clear up in a day or so.  SYMPTOMS TO REPORT IMMEDIATELY:   Following lower endoscopy (colonoscopy or flexible sigmoidoscopy):  Excessive amounts of blood in the stool  Significant tenderness or worsening of abdominal pains  Swelling of the abdomen that is new, acute  Fever of 100F or higher   Following upper endoscopy (EGD)  Vomiting of blood or coffee ground material  New chest pain or pain under the shoulder blades  Painful or persistently difficult swallowing  New shortness of  breath  Fever of 100F or higher  Black, tarry-looking stools  For urgent or emergent issues, a gastroenterologist can be reached at any hour by calling [phone]. Do not use MyChart messaging for urgent concerns.    DIET:  We do recommend a small meal at first, but then you may proceed to your regular diet.  Drink plenty of fluids but you should avoid alcoholic beverages for 24 hours.  ACTIVITY:  You should plan to take it easy for the rest of today and you should NOT DRIVE or use heavy machinery until tomorrow (because of the sedation medicines used during the test).    FOLLOW UP: Our staff will call the number listed on your records 48-72 hours following your procedure to check on you and address any questions or concerns that you may have regarding the information given to you following your procedure. If we do not reach you, we will leave a message.  We will attempt to reach you two times.  During this call, we will ask if you have developed any symptoms of COVID 19. If you develop any symptoms (ie: fever, flu-like symptoms, shortness of breath, cough etc.) before then, please call [phone].  If you test positive for Covid 19 in the 2 weeks post procedure, please call and report this information to [place].    If any biopsies were taken you will be contacted by phone or by letter within the next 1-3 weeks.  Please call us at [phone] if you have not  heard about the biopsies in 3 weeks.    SIGNATURES/CONFIDENTIALITY: You and/or your care partner have signed paperwork which will be entered into your electronic medical record.  These signatures attest to the fact that that the information above on your After Visit Summary has been reviewed and is understood.  Full responsibility of the confidentiality of this discharge information lies with you and/or your care-partner.

## 2020-11-12 NOTE — Progress Notes (Signed)
PT taken to PACU. Monitors in place. VSS. Report given to RN. 

## 2020-11-12 NOTE — Op Note (Signed)
[place] Patient Name: Melanie Guerrero Procedure Date: 11/12/2020 3:05 PM MRN: [id number] Endoscopist: Jackquline Denmark , MD Age: 70 Referring MD:  Date of Birth: 12/09/1950 Gender: Female Account #: [id number] Procedure:                Colonoscopy Indications:              Screening for colorectal malignant neoplasm Medicines:                Monitored Anesthesia Care Procedure:                Pre-Anesthesia Assessment:                           - Prior to the procedure, a History and Physical                            was performed, and patient medications and                            allergies were reviewed. The patient's tolerance of                            previous anesthesia was also reviewed. The risks                            and benefits of the procedure and the sedation                            options and risks were discussed with the patient.                            All questions were answered, and informed consent                            was obtained. Prior Anticoagulants: The patient has                            taken no previous anticoagulant or antiplatelet                            agents. ASA Grade Assessment: II - A patient with                            mild systemic disease. After reviewing the risks                            and benefits, the patient was deemed in                            satisfactory condition to undergo the procedure.                           After obtaining informed consent, the colonoscope  was passed under direct vision. Throughout the                            procedure, the patient's blood pressure, pulse, and                            oxygen saturations were monitored continuously. The                            Olympus PCF-H190DL (#[id number]) Colonoscope was                            introduced through the anus and advanced to the 2                            cm into the ileum. The  colonoscopy was performed                            without difficulty. The patient tolerated the                            procedure well. The quality of the bowel                            preparation was good. The terminal ileum, ileocecal                            valve, appendiceal orifice, and rectum were                            photographed. Scope In: 3:34:36 PM Scope Out: 3:54:22 PM Scope Withdrawal Time: 0 hours 13 minutes 20 seconds  Total Procedure Duration: 0 hours 19 minutes 46 seconds  Findings:                 A 6 mm polyp was found in the mid descending colon.                            The polyp was sessile. The polyp was removed with a                            cold snare. Resection and retrieval were complete.                           A few (2-3) small-mouthed diverticula were found in                            the sigmoid colon and mid transverse colon (1).                           Non-bleeding internal hemorrhoids were found during                            retroflexion. The hemorrhoids were small.  The terminal ileum appeared normal.                           The exam was otherwise without abnormality on                            direct and retroflexion views. Complications:            No immediate complications. Estimated Blood Loss:     Estimated blood loss: none. Impression:               - One 6 mm polyp in the mid descending colon,                            removed with a cold snare. Resected and retrieved.                           - Minimal colonic diverticulosis.                           - Non-bleeding internal hemorrhoids.                           - The examined portion of the ileum was normal.                           - The examination was otherwise normal on direct                            and retroflexion views. Recommendation:           - Patient has a contact number available for                             emergencies. The signs and symptoms of potential                            delayed complications were discussed with the                            patient. Return to normal activities tomorrow.                            Written discharge instructions were provided to the                            patient.                           - Resume previous diet.                           - Continue present medications.                           - Await pathology results.                           -  Repeat colonoscopy for surveillance based on                            pathology results.                           - The findings and recommendations were discussed                            with the patient's family. Jackquline Denmark, MD 11/12/2020 3:58:16 PM This report has been signed electronically.

## 2020-11-12 NOTE — Progress Notes (Signed)
[place]  Pt's states no medical or surgical changes since previsit or office visit.  

## 2020-11-12 NOTE — Progress Notes (Signed)
Called to room to assist during endoscopic procedure.  Patient ID and intended procedure confirmed with present staff. Received instructions for my participation in the procedure from the performing physician.  

## 2020-11-14 ENCOUNTER — Telehealth: Payer: Self-pay | Admitting: *Deleted

## 2020-11-14 NOTE — Telephone Encounter (Signed)
  Follow up Call-  Call back number 11/12/2020  Post procedure Call Back phone  # [phone]  Permission to leave phone message Yes  Some recent data might be hidden     Patient questions:  Do you have a fever, pain , or abdominal swelling? No. Pain Score  0 *  Have you tolerated food without any problems? Yes.    Have you been able to return to your normal activities? Yes.    Do you have any questions about your discharge instructions: Diet   No. Medications  No. Follow up visit  No.  Do you have questions or concerns about your Care? No.  Actions: * If pain score is 4 or above: No action needed, pain <4.  1. Have you developed a fever since your procedure? no  2.   Have you had an respiratory symptoms (SOB or cough) since your procedure? no  3.   Have you tested positive for COVID 19 since your procedure no  4.   Have you had any family members/close contacts diagnosed with the COVID 19 since your procedure?  no   If yes to any of these questions please route to Joylene John, RN and Joella Prince, RN

## 2020-11-28 ENCOUNTER — Encounter: Payer: Self-pay | Admitting: Gastroenterology

## 2020-12-23 ENCOUNTER — Encounter: Payer: Self-pay | Admitting: Family Medicine

## 2021-01-03 ENCOUNTER — Other Ambulatory Visit: Payer: Self-pay | Admitting: Family Medicine

## 2021-01-17 ENCOUNTER — Other Ambulatory Visit: Payer: Self-pay | Admitting: Family Medicine

## 2021-01-17 DIAGNOSIS — Z1231 Encounter for screening mammogram for malignant neoplasm of breast: Secondary | ICD-10-CM

## 2021-04-25 ENCOUNTER — Other Ambulatory Visit: Payer: Self-pay

## 2021-04-25 ENCOUNTER — Ambulatory Visit
Admission: RE | Admit: 2021-04-25 | Discharge: 2021-04-25 | Disposition: A | Discharge status: Home / Self Care | Payer: Medicare Other | Source: Ambulatory Visit | Attending: Family Medicine | Admitting: Family Medicine

## 2021-04-25 DIAGNOSIS — Z1231 Encounter for screening mammogram for malignant neoplasm of breast: Secondary | ICD-10-CM

## 2021-04-30 ENCOUNTER — Other Ambulatory Visit: Payer: Self-pay

## 2021-04-30 DIAGNOSIS — N6320 Unspecified lump in the left breast, unspecified quadrant: Secondary | ICD-10-CM

## 2021-04-30 DIAGNOSIS — R928 Other abnormal and inconclusive findings on diagnostic imaging of breast: Secondary | ICD-10-CM

## 2021-05-15 ENCOUNTER — Ambulatory Visit
Admission: RE | Admit: 2021-05-15 | Discharge: 2021-05-15 | Disposition: A | Discharge status: Home / Self Care | Payer: Medicare Other | Source: Ambulatory Visit | Attending: Family Medicine | Admitting: Family Medicine

## 2021-05-15 ENCOUNTER — Other Ambulatory Visit: Payer: Self-pay

## 2021-05-15 DIAGNOSIS — N6320 Unspecified lump in the left breast, unspecified quadrant: Secondary | ICD-10-CM

## 2021-05-15 DIAGNOSIS — R928 Other abnormal and inconclusive findings on diagnostic imaging of breast: Secondary | ICD-10-CM

## 2021-12-15 ENCOUNTER — Telehealth: Payer: Self-pay

## 2021-12-15 NOTE — Telephone Encounter (Signed)
I left message on voicemail to call us back to set up an appointment chronic visit and AWV ?

## 2021-12-18 ENCOUNTER — Ambulatory Visit (INDEPENDENT_AMBULATORY_CARE_PROVIDER_SITE_OTHER): Payer: Medicare Other

## 2021-12-18 DIAGNOSIS — Z23 Encounter for immunization: Secondary | ICD-10-CM | POA: Diagnosis not present

## 2022-02-01 ENCOUNTER — Other Ambulatory Visit: Payer: Self-pay | Admitting: Family Medicine

## 2022-04-01 ENCOUNTER — Other Ambulatory Visit: Payer: Self-pay | Admitting: Family Medicine

## 2022-04-01 DIAGNOSIS — Z1231 Encounter for screening mammogram for malignant neoplasm of breast: Secondary | ICD-10-CM

## 2022-05-06 ENCOUNTER — Other Ambulatory Visit: Payer: Self-pay | Admitting: Family Medicine

## 2022-05-29 ENCOUNTER — Other Ambulatory Visit: Payer: Self-pay | Admitting: Family Medicine

## 2022-06-03 ENCOUNTER — Ambulatory Visit
Admission: RE | Admit: 2022-06-03 | Discharge: 2022-06-03 | Disposition: A | Discharge status: Home / Self Care | Payer: Medicare Other | Source: Ambulatory Visit | Attending: Family Medicine | Admitting: Family Medicine

## 2022-06-03 DIAGNOSIS — Z1231 Encounter for screening mammogram for malignant neoplasm of breast: Secondary | ICD-10-CM

## 2022-08-11 ENCOUNTER — Telehealth: Payer: Self-pay | Admitting: Family Medicine

## 2022-08-11 NOTE — Telephone Encounter (Signed)
Left message for patient to call back and schedule Medicare Annual Wellness Visit (AWV) either virtually or phone   Left  my Melanie Guerrero number [phone]   awvi 07/24/21 per [facility]  please schedule with hannah kim  40 min    45 min for awv-i and in office appointments 30 min for awv-s  phone/virtual appointments

## 2022-08-13 ENCOUNTER — Other Ambulatory Visit: Payer: Self-pay | Admitting: Family Medicine

## 2022-09-17 ENCOUNTER — Telehealth: Payer: Medicare Other | Admitting: Family Medicine

## 2022-11-28 ENCOUNTER — Other Ambulatory Visit: Payer: Self-pay | Admitting: Family Medicine

## 2022-12-23 ENCOUNTER — Other Ambulatory Visit: Payer: Self-pay | Admitting: Family Medicine

## 2023-02-22 ENCOUNTER — Telehealth: Payer: Self-pay

## 2023-02-22 NOTE — Telephone Encounter (Signed)
Contacted Melanie Guerrero to schedule their annual wellness visit. I left a message asking the patient to call back to see getting an appointment scheduled.

## 2023-02-22 NOTE — Telephone Encounter (Signed)
This patient will need a chronic f.up fasting appointment before getting her AWV. Please schedule both appointments but will need to be separate. Ok to schedule with Dr. Faylene Kurtz

## 2023-04-20 ENCOUNTER — Other Ambulatory Visit: Payer: Self-pay | Admitting: Family Medicine

## 2023-04-20 DIAGNOSIS — Z1231 Encounter for screening mammogram for malignant neoplasm of breast: Secondary | ICD-10-CM

## 2023-06-07 ENCOUNTER — Ambulatory Visit
Admission: RE | Admit: 2023-06-07 | Discharge: 2023-06-07 | Disposition: A | Discharge status: Home / Self Care | Payer: Medicare Other | Source: Ambulatory Visit | Attending: Family Medicine | Admitting: Family Medicine

## 2023-06-07 DIAGNOSIS — Z1231 Encounter for screening mammogram for malignant neoplasm of breast: Secondary | ICD-10-CM

## 2023-10-18 ENCOUNTER — Encounter: Payer: Self-pay | Admitting: Family Medicine

## 2023-10-18 ENCOUNTER — Ambulatory Visit (INDEPENDENT_AMBULATORY_CARE_PROVIDER_SITE_OTHER): Payer: Medicare Other

## 2023-10-18 ENCOUNTER — Other Ambulatory Visit: Payer: Self-pay | Admitting: Family Medicine

## 2023-10-18 VITALS — BP 158/84 | HR 82 | Temp 97.5°F | Ht 67.0 in | Wt 197.0 lb

## 2023-10-18 DIAGNOSIS — I10 Essential (primary) hypertension: Secondary | ICD-10-CM

## 2023-10-18 DIAGNOSIS — Z23 Encounter for immunization: Secondary | ICD-10-CM

## 2023-10-18 DIAGNOSIS — K5903 Drug induced constipation: Secondary | ICD-10-CM | POA: Insufficient documentation

## 2023-10-18 DIAGNOSIS — E039 Hypothyroidism, unspecified: Secondary | ICD-10-CM

## 2023-10-18 DIAGNOSIS — Z131 Encounter for screening for diabetes mellitus: Secondary | ICD-10-CM | POA: Diagnosis not present

## 2023-10-18 DIAGNOSIS — Z6831 Body mass index (BMI) 31.0-31.9, adult: Secondary | ICD-10-CM

## 2023-10-18 DIAGNOSIS — Z13220 Encounter for screening for lipoid disorders: Secondary | ICD-10-CM

## 2023-10-18 DIAGNOSIS — E66811 Obesity, class 1: Secondary | ICD-10-CM | POA: Diagnosis not present

## 2023-10-18 DIAGNOSIS — E6609 Other obesity due to excess calories: Secondary | ICD-10-CM

## 2023-10-18 DIAGNOSIS — Z6830 Body mass index (BMI) 30.0-30.9, adult: Secondary | ICD-10-CM

## 2023-10-18 MED ORDER — LOSARTAN POTASSIUM-HCTZ 50-12.5 MG PO TABS: 0.5000 | ORAL_TABLET | Freq: Every day | ORAL | 0 refills | Status: DC

## 2023-10-18 NOTE — Assessment & Plan Note (Signed)
 Hypothyroidism, well-managed with current thyroid medication. Recent thyroid function tests were within normal limits. - Continue current thyroid medication

## 2023-10-18 NOTE — Progress Notes (Signed)
 Subjective:  Patient ID: Melanie Guerrero, female    DOB: 02-21-1951  Age: 73 y.o. MRN: [id number]  Chief Complaint  Patient presents with   Hypertension    Hypertension Pertinent negatives include no chest pain, headaches or shortness of breath.  Patient noticed blood pressures staying in 150-160's diastolic and in the 90's systolic for about a week.     Melanie Guerrero is a 73 year old female with hypertension who presents with elevated blood pressure readings. She has a history of hypertension, initially diagnosed approximately two years ago when she presented with high blood pressure and stomach pain. At that time, she underwent an endoscopy and colonoscopy, which were unremarkable.   She was started on a combination pill of losartan hydrochlorothiazide, which effectively controlled her blood pressure but eventually she stopped taking it as her BP was better controlled.. Recently, she noticed an increase in her blood pressure and has not been taking the medication due to a lack of refills.   Her blood pressure readings in the morning are high, but a recent reading at her endocrinologist's office was 110/84. She has not been taking her blood pressure medication recently and has requested a refill. She typically checks her blood pressure in the morning. Her blood pressure was well-managed, and she lost a significant amount of weight, which led her to test if she could manage without the medication.   She has a history of hypothyroidism and is compliant with her thyroid medication. She also mentions a previous prescription of metformin, which she took for weight loss and longevity, not for diabetes, as she does not have A1c issues. She has not been taking metformin recently.   In terms of lifestyle, she mentions a variable exercise routine, ranging from 2,000 to 10,000 steps per day, She follows a very low-carb diet but admits to not being consistent recently. She lives with her husband and has  been trying to manage her health independently.      10/18/2023    2:11 PM  Depression screen PHQ 2/9  Decreased Interest 0  Down, Depressed, Hopeless 0  PHQ - 2 Score 0        10/18/2023    2:11 PM  Fall Risk   Falls in the past year? 0  Number falls in past yr: 0  Injury with Fall? 0  Risk for fall due to : No Fall Risks    Patient Care Team: Windell Moment, MD as PCP - General (Family Medicine)   Review of Systems  Constitutional:  Negative for chills, fatigue and fever.  HENT:  Negative for congestion, ear pain and sore throat.   Respiratory:  Negative for cough and shortness of breath.   Cardiovascular:  Negative for chest pain.  Gastrointestinal:  Negative for abdominal pain, constipation, diarrhea, nausea and vomiting.  Genitourinary:  Negative for dysuria and frequency.  Musculoskeletal:  Negative for arthralgias and myalgias.  Neurological:  Negative for dizziness and headaches.  Psychiatric/Behavioral:  Negative for dysphoric mood. The patient is not nervous/anxious.     Current Outpatient Medications on File Prior to Visit  Medication Sig Dispense Refill   AMBULATORY NON FORMULARY MEDICATION Medication Name: Superbeets- powder mix she mixes with water     B Complex Vitamins (VITAMIN B COMPLEX PO) Take by mouth.     Cholecalciferol (VITAMIN D3 PO) Take by mouth.     ELDERBERRY PO Take by mouth.     levothyroxine (SYNTHROID) 150 MCG tablet TAKE 1 TABLET BY  MOUTH DAILY EXCEPT 1/2 TABLET ON SATURDAY AND SUNDAY     liothyronine (CYTOMEL) 5 MCG tablet Take 1 tablet by mouth daily.     metFORMIN (GLUCOPHAGE-XR) 500 MG 24 hr tablet Take 500 mg by mouth 2 (two) times daily.     Multiple Vitamin (MULTIVITAMIN) tablet Take 1 tablet by mouth daily.     Omega-3 Fatty Acids (FISH OIL PO) Take by mouth.     omeprazole (PRILOSEC) 40 MG capsule TAKE 1 CAPSULE BY MOUTH TWICE A DAY 60 capsule 2   No current facility-administered medications on file prior to visit.   Past  Medical History:  Diagnosis Date   Chronic headaches    Hyperlipidemia    Hypertension    Obesity    Thyroid disease    Hypothyroidism   UTI (urinary tract infection)    Past Surgical History:  Procedure Laterality Date   CHOLECYSTECTOMY     EYE SURGERY  02/2020   cataracts BL.     Family History  Problem Relation Age of Onset   Dementia Mother    Breast cancer Mother 31   Hypothyroidism Sister    Colon cancer Neg Hx    Esophageal cancer Neg Hx    Social History   Socioeconomic History   Marital status: Married    Spouse name: Not on file   Number of children: Not on file   Years of education: Not on file   Highest education level: Bachelor's degree (e.g., BA, AB, BS)  Occupational History   Not on file  Tobacco Use   Smoking status: Never   Smokeless tobacco: Never  Vaping Use   Vaping status: Never Used  Substance and Sexual Activity   Alcohol use: Yes    Alcohol/week: 3.0 standard drinks of alcohol    Types: 3 Glasses of wine per week   Drug use: Not Currently   Sexual activity: Not on file  Other Topics Concern   Not on file  Social History Narrative   Not on file   Social Drivers of Health   Financial Resource Strain: Low Risk  (10/18/2023)   Overall Financial Resource Strain (CARDIA)    Difficulty of Paying Living Expenses: Not hard at all  Food Insecurity: No Food Insecurity (10/18/2023)   Hunger Vital Sign    Worried About Running Out of Food in the Last Year: Never true    Ran Out of Food in the Last Year: Never true  Transportation Needs: No Transportation Needs (10/18/2023)   PRAPARE - Administrator, Civil Service (Medical): No    Lack of Transportation (Non-Medical): No  Physical Activity: Insufficiently Active (10/18/2023)   Exercise Vital Sign    Days of Exercise per Week: 2 days    Minutes of Exercise per Session: 30 min  Stress: No Stress Concern Present (10/18/2023)   [employer] of Occupational Health - Occupational  Stress Questionnaire    Feeling of Stress : Only a little  Social Connections: Moderately Integrated (10/18/2023)   Social Connection and Isolation Panel [NHANES]    Frequency of Communication with Friends and Family: Once a week    Frequency of Social Gatherings with Friends and Family: Once a week    Attends Religious Services: More than 4 times per year    Active Member of [employer] or Organizations: Yes    Attends Engineer, structural: More than 4 times per year    Marital Status: Married    Objective:  BP (!) 158/84  Pulse 82   Temp (!) 97.5 F (36.4 C)   Ht 5\' 7"  (1.702 m)   Wt 197 lb (89.4 kg)   PF 98 L/min   BMI 30.85 kg/m      10/18/2023    2:59 PM 10/18/2023    2:09 PM 11/12/2020    4:19 PM  BP/Weight  Systolic BP 158 166 141  Diastolic BP 84 92 76  Wt. (Lbs)  197   BMI  30.85 kg/m2     Physical Exam Vitals and nursing note reviewed.  Constitutional:      Appearance: Normal appearance.  HENT:     Head: Normocephalic and atraumatic.  Cardiovascular:     Rate and Rhythm: Normal rate and regular rhythm.  Pulmonary:     Effort: Pulmonary effort is normal.     Breath sounds: Normal breath sounds.  Musculoskeletal:        General: Normal range of motion.  Neurological:     General: No focal deficit present.     Mental Status: She is alert.  Psychiatric:        Mood and Affect: Mood normal.     Diabetic Foot Exam - Simple   No data filed      Lab Results  Component Value Date   WBC 12.8 (H) 09/13/2020   HGB 14.4 09/13/2020   HCT 42.1 09/13/2020   PLT 272 09/13/2020   GLUCOSE 105 (H) 09/13/2020   ALT 13 09/13/2020   AST 14 09/13/2020   NA 139 09/13/2020   K 4.2 09/13/2020   CL 100 09/13/2020   CREATININE 0.95 09/13/2020   BUN 19 09/13/2020   CO2 26 09/13/2020      Assessment & Plan:    Benign essential hypertension Assessment & Plan: Hypertension, previously well-controlled on losartan hydrochlorothiazide.   Discontinued  medication after weight loss and lifestyle changes, but noted increased blood pressure recently (155/92 mmHg). Discussed importance of consistent medication use and evening blood pressure monitoring for accurate readings.   Explained benefits of restarting losartan hydrochlorothiazide and need for regular monitoring.   - Restart losartan hydrochlorothiazide 50/12.5 mg, take half a tablet daily - Monitor blood pressure in the evening after resting, maintain a log - Follow up with blood pressure log in one week   Orders: -     Comprehensive metabolic panel; Future  Acquired hypothyroidism Assessment & Plan: Hypothyroidism, well-managed with current thyroid medication. Recent thyroid function tests were within normal limits. - Continue current thyroid medication    Class 1 obesity due to excess calories without serious comorbidity with body mass index (BMI) of 30.0 to 30.9 in adult  Screening for diabetes mellitus  Screening for hyperlipidemia -     Lipid panel; Future  Encounter for immunization -     Flu Vaccine Trivalent High Dose (Fluad)  Class 1 obesity due to excess calories with serious comorbidity and body mass index (BMI) of 31.0 to 31.9 in adult Assessment & Plan: Metabolic syndrome, previously managed with metformin. Currently not on metformin. Discussed benefits and risks of metformin, including its role in reducing cardiovascular morbidity and mortality. Explained metformin's benefits for metabolic syndrome and weight loss. Patient's BMI qualifies her for metformin as a weight loss aid.   - Order blood work including A1c and lipid panel   - Discuss potential re-initiation of metformin based on blood work results   Obesity BMI of 30. Discussed importance of lifestyle modifications including diet, exercise, and strength training.   Emphasized setting specific,  achievable goals and maintaining consistency.   Explained that a combination of lifestyle changes and medication  is most effective. Encouraged balanced diet focusing on total caloric intake and regular exercise, including strength training, to prevent osteoporosis and improve overall health.  - Encourage balanced diet focusing on total caloric intake   - Recommend regular exercise, including strength training - Set specific, achievable goals for weight loss and exercise    Other orders -     Losartan Potassium-HCTZ; Take 0.5 tablets by mouth daily.  Dispense: 45 tablet; Refill: 0     Meds ordered this encounter  Medications   losartan-hydrochlorothiazide (HYZAAR) 50-12.5 MG tablet    Sig: Take 0.5 tablets by mouth daily.    Dispense:  45 tablet    Refill:  0    Patient needs an appointment.    Orders Placed This Encounter  Procedures   Flu Vaccine Trivalent High Dose (Fluad)   Comprehensive metabolic panel   Lipid panel     Follow-up: Return in about 8 weeks (around 12/13/2023) for chronic disease follow up.     An After Visit Summary was printed and given to the patient.  Windell Moment, MD [facility] [phone]

## 2023-10-18 NOTE — Assessment & Plan Note (Signed)
 Hypertension, previously well-controlled on losartan hydrochlorothiazide.   Discontinued medication after weight loss and lifestyle changes, but noted increased blood pressure recently (155/92 mmHg). Discussed importance of consistent medication use and evening blood pressure monitoring for accurate readings.   Explained benefits of restarting losartan hydrochlorothiazide and need for regular monitoring.   - Restart losartan hydrochlorothiazide 50/12.5 mg, take half a tablet daily - Monitor blood pressure in the evening after resting, maintain a log - Follow up with blood pressure log in one week

## 2023-10-18 NOTE — Assessment & Plan Note (Signed)
 Metabolic syndrome, previously managed with metformin. Currently not on metformin. Discussed benefits and risks of metformin, including its role in reducing cardiovascular morbidity and mortality. Explained metformin's benefits for metabolic syndrome and weight loss. Patient's BMI qualifies her for metformin as a weight loss aid.   - Order blood work including A1c and lipid panel   - Discuss potential re-initiation of metformin based on blood work results   Obesity BMI of 30. Discussed importance of lifestyle modifications including diet, exercise, and strength training.   Emphasized setting specific, achievable goals and maintaining consistency.   Explained that a combination of lifestyle changes and medication is most effective. Encouraged balanced diet focusing on total caloric intake and regular exercise, including strength training, to prevent osteoporosis and improve overall health.  - Encourage balanced diet focusing on total caloric intake   - Recommend regular exercise, including strength training - Set specific, achievable goals for weight loss and exercise

## 2023-10-18 NOTE — Patient Instructions (Signed)
 VISIT SUMMARY:  Today, we discussed your elevated blood pressure readings and reviewed your overall health. We talked about the importance of consistent medication use, lifestyle modifications, and regular monitoring to manage your conditions effectively.  YOUR PLAN:  -HYPERTENSION: Hypertension means high blood pressure. Your blood pressure was previously well-controlled with medication, but it has increased recently. We discussed the importance of taking your medication consistently and monitoring your blood pressure in the evening. You should restart taking losartan hydrochlorothiazide 50/12.5 mg, half a tablet daily, and keep a log of your evening blood pressure readings. Please follow up with your blood pressure log in one week.  -METABOLIC SYNDROME: Metabolic syndrome is a group of conditions that increase your risk of heart disease, stroke, and diabetes. We discussed the benefits and risks of metformin, which can help manage this condition and aid in weight loss. We will order blood work, including A1c and a lipid panel, to decide if you should restart metformin.  -OBESITY: Obesity means having a body mass index (BMI) of 30 or higher. We talked about the importance of lifestyle changes, including a balanced diet and regular exercise, to help with weight loss and overall health. Setting specific, achievable goals and maintaining consistency is key. Strength training is also recommended to prevent osteoporosis and improve health.  -HYPOTHYROIDISM: Hypothyroidism is when your thyroid gland doesn't produce enough hormones. Your condition is well-managed with your current medication, and your recent thyroid function tests are normal. Continue taking your current thyroid medication as prescribed.  -GENERAL HEALTH MAINTENANCE: You are due for routine blood work and a flu shot. Regular health maintenance is important to keep track of your overall health. We will administer the flu shot and schedule your  blood work for Thursday morning.  INSTRUCTIONS:  Please follow up with your blood pressure log in one week. Schedule a follow-up appointment in two months to review your blood work results and adjust your treatment plan as necessary.

## 2023-10-26 ENCOUNTER — Ambulatory Visit: Payer: Medicare Other

## 2023-10-26 DIAGNOSIS — I10 Essential (primary) hypertension: Secondary | ICD-10-CM

## 2023-10-26 DIAGNOSIS — Z13220 Encounter for screening for lipoid disorders: Secondary | ICD-10-CM

## 2023-10-26 LAB — COMPREHENSIVE METABOLIC PANEL
ALT: 27 IU/L (ref 0–32)
AST: 18 IU/L (ref 0–40)
Albumin: 4.2 g/dL (ref 3.8–4.8)
Alkaline Phosphatase: 75 IU/L (ref 44–121)
BUN/Creatinine Ratio: 19 (ref 12–28)
BUN: 20 mg/dL (ref 8–27)
Bilirubin Total: 0.3 mg/dL (ref 0.0–1.2)
CO2: 23 mmol/L (ref 20–29)
Calcium: 9.7 mg/dL (ref 8.7–10.3)
Chloride: 102 mmol/L (ref 96–106)
Creatinine, Ser: 1.05 mg/dL — ABNORMAL HIGH (ref 0.57–1.00)
Globulin, Total: 2.7 g/dL (ref 1.5–4.5)
Glucose: 82 mg/dL (ref 70–99)
Potassium: 4.1 mmol/L (ref 3.5–5.2)
Sodium: 140 mmol/L (ref 134–144)
Total Protein: 6.9 g/dL (ref 6.0–8.5)
eGFR: 57 mL/min/{1.73_m2} — ABNORMAL LOW (ref >59)

## 2023-10-26 LAB — LIPID PANEL
Chol/HDL Ratio: 2.7 ratio (ref 0.0–4.4)
Cholesterol, Total: 184 mg/dL (ref 100–199)
HDL: 67 mg/dL (ref >39)
LDL Chol Calc (NIH): 105 mg/dL — ABNORMAL HIGH (ref 0–99)
Triglycerides: 66 mg/dL (ref 0–149)
VLDL Cholesterol Cal: 12 mg/dL (ref 5–40)

## 2023-12-14 ENCOUNTER — Ambulatory Visit: Payer: Medicare Other

## 2024-01-03 ENCOUNTER — Other Ambulatory Visit: Payer: Self-pay

## 2024-01-03 ENCOUNTER — Encounter (HOSPITAL_COMMUNITY): Payer: Self-pay

## 2024-01-03 MED ORDER — LOSARTAN POTASSIUM-HCTZ 50-12.5 MG PO TABS: 0.5000 | ORAL_TABLET | Freq: Every day | ORAL | 0 refills | Status: DC

## 2024-01-05 NOTE — Telephone Encounter (Signed)
 Copied from CRM [id number]. Topic: Clinical - Prescription Issue >> Jan 05, 2024 11:00 AM Santiya F wrote: Reason for CRM: Patient is calling in because she requested a refill for her medication losartan -hydrochlorothiazide (HYZAAR) 50-12.5 MG tablet [[id number]]. Patient says her provider approved of her taking the full pill instead of the half pill. Patient says she needs the provider to send in a refill for a whole pill instead of half pill because she is out and insurance won't cover it as a half pill because it would be too early.

## 2024-01-07 ENCOUNTER — Other Ambulatory Visit: Payer: Self-pay

## 2024-01-07 MED ORDER — LOSARTAN POTASSIUM-HCTZ 50-12.5 MG PO TABS: 1.0000 | ORAL_TABLET | Freq: Every day | ORAL | 1 refills | Status: DC

## 2024-01-07 NOTE — Telephone Encounter (Signed)
 Copied from CRM [id number]. Topic: Clinical - Prescription Issue >> Jan 07, 2024  8:03 AM Carlatta H wrote: Reason for CRM: Patient is calling in because she requested a refill for her medication losartan -hydrochlorothiazide (HYZAAR) 50-12.5 MG tablet [[id number]]. Patient says her provider approved of her taking the full pill instead of the half pill. Patient says she needs the provider to send in a refill for a whole pill instead of half pill because she is out and insurance won't cover it as a half pill because it would be too early.

## 2024-01-10 ENCOUNTER — Ambulatory Visit

## 2024-01-27 ENCOUNTER — Ambulatory Visit

## 2024-01-28 ENCOUNTER — Ambulatory Visit (INDEPENDENT_AMBULATORY_CARE_PROVIDER_SITE_OTHER)

## 2024-01-28 VITALS — BP 120/80 | HR 83 | Temp 97.3°F | Resp 14 | Ht 67.0 in | Wt 197.0 lb

## 2024-01-28 DIAGNOSIS — E2839 Other primary ovarian failure: Secondary | ICD-10-CM

## 2024-01-28 DIAGNOSIS — I10 Essential (primary) hypertension: Secondary | ICD-10-CM | POA: Diagnosis not present

## 2024-01-28 NOTE — Patient Instructions (Signed)
  VISIT SUMMARY: Today, we discussed your blood pressure management, kidney function, and cholesterol levels. Your blood pressure readings at home are stable, and we emphasized the importance of maintaining control to protect your kidney function. We also reviewed your cholesterol levels and general health maintenance, including the need for a bone density test and vitamin D supplementation.  YOUR PLAN: HYPERTENSION: Your blood pressure readings at home are within normal limits for your age. -Continue taking losartan  hydrochlorothiazide as prescribed. -Maintain a high potassium, low sodium diet. -Increase exercise intensity to achieve a heart rate of at least 110 bpm for effective weight loss and cardiovascular health. -Aim for 45 minutes of cardio exercise 5 times a week. -Incorporate strength training 2-3 times a week.  CHRONIC KIDNEY DISEASE STAGE 3: Your kidney function has slightly decreased, which is common with aging. -Monitor kidney function every 4-6 months. -Maintain blood pressure control to prevent further decline in kidney function.  HYPERLIPIDEMIA: Your LDL cholesterol is slightly above the target level. -Encourage a healthy diet with lots of fiber. -Continue regular exercise.  GENERAL HEALTH MAINTENANCE: We discussed the importance of regular screenings and supplements for overall health maintenance. -Order a bone density test to be done with your upcoming mammogram. -Encourage taking a vitamin D supplement. -Discuss potential constipation with calcium supplements.             Contains text generated by [employer]bridge.                                 Contains text generated by Abridge.

## 2024-01-28 NOTE — Progress Notes (Signed)
 Subjective:  Patient ID: Melanie Guerrero, female    DOB: Jul 11, 1951  Age: 73 y.o. MRN: [id number]  Chief Complaint  Patient presents with   Hypertension    HPI: Discussed the use of AI scribe software for clinical note transcription with the patient, who gave verbal consent to proceed.  History of Present Illness   Melanie Guerrero is a 73 year old female with hypertension who presents for blood pressure management and kidney function monitoring.  Her blood pressure was previously noted to be elevated, leading to an adjustment in her medication regimen. Initially, she was prescribed half a pill, which she found ineffective, prompting her to increase to a full pill. Home blood pressure readings have been stable, ranging from 122 to 135 mmHg systolic and 70s to 80 mmHg diastolic. She experiences no dizziness or other symptoms related to blood pressure changes.  She is currently taking losartan  hydrochlorothiazide, metformin 500 mg twice daily, and Synthroid 150 mcg daily. She occasionally uses a potassium shaker and is mindful of her sodium intake. She engages in regular physical activity, including walking and strength training, and is attempting to lose weight, maintaining a stable weight of 197 pounds.  Her recent kidney function test showed a filtration rate of 57, a decrease from 62 in 2022. She has no history of abnormal kidney function until this recent test.  She has a family history of osteoporosis, as her mother had the condition, but she has not shown any signs herself. She has not had a bone density test in a long time but has a mammogram scheduled for October.  No chest pain, shortness of breath, or other significant symptoms. Bowel movements are regular. No gastrointestinal side effects from her medications.         01/28/2024    8:13 AM 10/18/2023    2:11 PM  Depression screen PHQ 2/9  Decreased Interest 1 0  Down, Depressed, Hopeless 0 0  PHQ - 2 Score 1 0  Altered sleeping  0   Tired, decreased energy 1   Change in appetite 0   Feeling bad or failure about yourself  0   Trouble concentrating 0   Moving slowly or fidgety/restless 0   Suicidal thoughts 0   PHQ-9 Score 2   Difficult doing work/chores Not difficult at all         01/28/2024    8:13 AM  Fall Risk   Falls in the past year? 0  Number falls in past yr: 0  Injury with Fall? 0  Risk for fall due to : No Fall Risks  Follow up Falls evaluation completed    Patient Care Team: Laporsha Grealish, MD as PCP - General (Family Medicine)   Review of Systems  Constitutional:  Negative for chills, fatigue and fever.  HENT:  Negative for congestion, ear pain and sore throat.   Respiratory:  Negative for cough and shortness of breath.   Cardiovascular:  Negative for chest pain and palpitations.  Gastrointestinal:  Negative for abdominal pain, constipation, diarrhea, nausea and vomiting.  Endocrine: Negative for polydipsia, polyphagia and polyuria.  Genitourinary:  Negative for difficulty urinating and dysuria.  Musculoskeletal:  Negative for arthralgias, back pain and myalgias.  Skin:  Negative for rash.  Neurological:  Negative for headaches.  Psychiatric/Behavioral:  Negative for dysphoric mood. The patient is not nervous/anxious.     Current Outpatient Medications on File Prior to Visit  Medication Sig Dispense Refill   B Complex Vitamins (VITAMIN B  COMPLEX PO) Take by mouth.     Cholecalciferol (VITAMIN D3 PO) Take by mouth.     levothyroxine (SYNTHROID) 150 MCG tablet TAKE 1 TABLET BY MOUTH DAILY EXCEPT 1/2 TABLET ON SATURDAY AND SUNDAY     liothyronine (CYTOMEL) 5 MCG tablet Take 1 tablet by mouth daily.     losartan -hydrochlorothiazide (HYZAAR) 50-12.5 MG tablet Take 1 tablet by mouth daily. 90 tablet 1   metFORMIN (GLUCOPHAGE-XR) 500 MG 24 hr tablet Take 500 mg by mouth 2 (two) times daily.     Multiple Vitamin (MULTIVITAMIN) tablet Take 1 tablet by mouth daily.     No current  facility-administered medications on file prior to visit.   Past Medical History:  Diagnosis Date   Chronic headaches    Hyperlipidemia    Hypertension    Obesity    Thyroid  disease    Hypothyroidism   UTI (urinary tract infection)    Past Surgical History:  Procedure Laterality Date   CHOLECYSTECTOMY     EYE SURGERY  02/2020   cataracts BL.     Family History  Problem Relation Age of Onset   Dementia Mother    Breast cancer Mother 73   Hypothyroidism Sister    Colon cancer Neg Hx    Esophageal cancer Neg Hx    Social History   Socioeconomic History   Marital status: Married    Spouse name: Not on file   Number of children: Not on file   Years of education: Not on file   Highest education level: Bachelor's degree (e.g., BA, AB, BS)  Occupational History   Not on file  Tobacco Use   Smoking status: Never   Smokeless tobacco: Never  Vaping Use   Vaping status: Never Used  Substance and Sexual Activity   Alcohol use: Yes    Alcohol/week: 3.0 standard drinks of alcohol    Types: 3 Glasses of wine per week   Drug use: Not Currently   Sexual activity: Not on file  Other Topics Concern   Not on file  Social History Narrative   Not on file   Social Drivers of Health   Financial Resource Strain: Low Risk  (10/18/2023)   Overall Financial Resource Strain (CARDIA)    Difficulty of Paying Living Expenses: Not hard at all  Food Insecurity: No Food Insecurity (10/18/2023)   Hunger Vital Sign    Worried About Running Out of Food in the Last Year: Never true    Ran Out of Food in the Last Year: Never true  Transportation Needs: No Transportation Needs (10/18/2023)   PRAPARE - Administrator, Civil Service (Medical): No    Lack of Transportation (Non-Medical): No  Physical Activity: Insufficiently Active (10/18/2023)   Exercise Vital Sign    Days of Exercise per Week: 2 days    Minutes of Exercise per Session: 30 min  Stress: No Stress Concern Present  (10/18/2023)   [employer] of Occupational Health - Occupational Stress Questionnaire    Feeling of Stress : Only a little  Social Connections: Moderately Integrated (10/18/2023)   Social Connection and Isolation Panel [NHANES]    Frequency of Communication with Friends and Family: Once a week    Frequency of Social Gatherings with Friends and Family: Once a week    Attends Religious Services: More than 4 times per year    Active Member of [employer] or Organizations: Yes    Attends Banker Meetings: More than 4 times per year  Marital Status: Married    Objective:  BP 120/80   Pulse 83   Temp (!) 97.3 F (36.3 C)   Resp 14   Ht 5\' 7"  (1.702 m)   Wt 197 lb (89.4 kg)   SpO2 97%   BMI 30.85 kg/m      01/28/2024    8:07 AM 10/18/2023    2:59 PM 10/18/2023    2:09 PM  BP/Weight  Systolic BP 120 158 166  Diastolic BP 80 84 92  Wt. (Lbs) 197  197  BMI 30.85 kg/m2  30.85 kg/m2    Physical Exam Vitals and nursing note reviewed.  HENT:     Head: Normocephalic and atraumatic.  Cardiovascular:     Rate and Rhythm: Normal rate and regular rhythm.  Pulmonary:     Effort: Pulmonary effort is normal.     Breath sounds: Normal breath sounds.  Musculoskeletal:        General: Normal range of motion.  Neurological:     General: No focal deficit present.     Mental Status: She is alert and oriented to person, place, and time.  Psychiatric:        Mood and Affect: Mood normal.     Diabetic Foot Exam - Simple   No data filed      Lab Results  Component Value Date   WBC 12.8 (H) 09/13/2020   HGB 14.4 09/13/2020   HCT 42.1 09/13/2020   PLT 272 09/13/2020   GLUCOSE 82 10/26/2023   CHOL 184 10/26/2023   TRIG 66 10/26/2023   HDL 67 10/26/2023   LDLCALC 105 (H) 10/26/2023   ALT 27 10/26/2023   AST 18 10/26/2023   NA 140 10/26/2023   K 4.1 10/26/2023   CL 102 10/26/2023   CREATININE 1.05 (H) 10/26/2023   BUN 20 10/26/2023   CO2 23 10/26/2023    The  10-year ASCVD risk score (Arnett DK, et al., 2019) is: 11.8%   Values used to calculate the score:     Age: 53 years     Sex: Female     Is Non-Hispanic African American: No     Diabetic: No     Tobacco smoker: No     Systolic Blood Pressure: 120 mmHg     Is BP treated: Yes     HDL Cholesterol: 67 mg/dL     Total Cholesterol: 184 mg/dL   Assessment & Plan:  Estrogen deficiency Assessment & Plan:  Family history of osteoporosis but no signs present. Discussed potential constipation with calcium supplements and the importance of vitamin D. - Order bone density test to be done with upcoming mammogram - Encourage taking vitamin D supplement - Discuss potential constipation with calcium supplements   Orders: -     DG Bone Density; Future  Benign essential hypertension Assessment & Plan: Blood pressure readings at home range from 122/70s to 135/80s, within normal limits for her age. Current medication is losartan  50- hydrochlorothiazide 12.5 mg.   No dizziness reported. Emphasized maintaining blood pressure control to prevent kidney function decline. Encouraged lifestyle modifications, including increased exercise and dietary changes, to improve blood pressure control. Target heart rate of at least 110 bpm during exercise for effective weight loss and cardiovascular health. - Continue losartan  hydrochlorothiazide - Encourage high potassium, low sodium diet - Increase exercise intensity to achieve a heart rate of at least 110 bpm for weight loss - Aim for 45 minutes of cardio exercise 5 times a week - Incorporate  strength training 2-3 times a week    GFR decreased from 62 in 2022 to 57 on her last blood work in March 2025. Explained that this is a common finding with aging and not immediately concerning. Emphasized the importance of blood pressure control to prevent further decline in kidney function. Explained that a GFR below 60 is monitored closely, but many patients remain stable  between 30 and 60 for life. No specific cause identified for the decline in GFR. - Monitor kidney function every 4-6 months - Maintain blood pressure control   LDL cholesterol is 105, slightly above the target of less than 100. Discussed lifestyle modifications to improve cholesterol levels. No statin therapy initiated due to overall good health and family history of good cholesterol levels. Calculated a 10-year atherosclerotic cardiovascular disease risk score of 11.8%, slightly elevated but not concerning given her overall health profile. - Encourage healthy diet with lots of fiber - Encourage regular exercise  Orders: -     Comprehensive metabolic panel with GFR   Assessment and Plan            No orders of the defined types were placed in this encounter.   Orders Placed This Encounter  Procedures   DG BONE DENSITY (DXA)   Comprehensive metabolic panel with GFR     Follow-up: Return in about 6 months (around 07/29/2024) for wellness exam.    An After Visit Summary was printed and given to the patient.  Phillippa Straub, MD [facility] [phone]

## 2024-01-28 NOTE — Assessment & Plan Note (Addendum)
 Blood pressure readings at home range from 122/70s to 135/80s, within normal limits for her age. Current medication is losartan  50- hydrochlorothiazide 12.5 mg.   No dizziness reported. Emphasized maintaining blood pressure control to prevent kidney function decline. Encouraged lifestyle modifications, including increased exercise and dietary changes, to improve blood pressure control. Target heart rate of at least 110 bpm during exercise for effective weight loss and cardiovascular health. - Continue losartan  hydrochlorothiazide - Encourage high potassium, low sodium diet - Increase exercise intensity to achieve a heart rate of at least 110 bpm for weight loss - Aim for 45 minutes of cardio exercise 5 times a week - Incorporate strength training 2-3 times a week    GFR decreased from 62 in 2022 to 57 on her last blood work in March 2025. Explained that this is a common finding with aging and not immediately concerning. Emphasized the importance of blood pressure control to prevent further decline in kidney function. Explained that a GFR below 60 is monitored closely, but many patients remain stable between 30 and 60 for life. No specific cause identified for the decline in GFR. - Monitor kidney function every 4-6 months - Maintain blood pressure control   LDL cholesterol is 105, slightly above the target of less than 100. Discussed lifestyle modifications to improve cholesterol levels. No statin therapy initiated due to overall good health and family history of good cholesterol levels. Calculated a 10-year atherosclerotic cardiovascular disease risk score of 11.8%, slightly elevated but not concerning given her overall health profile. - Encourage healthy diet with lots of fiber - Encourage regular exercise

## 2024-01-28 NOTE — Assessment & Plan Note (Signed)
 Family history of osteoporosis but no signs present. Discussed potential constipation with calcium supplements and the importance of vitamin D. - Order bone density test to be done with upcoming mammogram - Encourage taking vitamin D supplement - Discuss potential constipation with calcium supplements

## 2024-01-30 LAB — COMPREHENSIVE METABOLIC PANEL WITH GFR
ALT: 15 IU/L (ref 0–32)
AST: 13 IU/L (ref 0–40)
Albumin: 4.2 g/dL (ref 3.8–4.8)
Alkaline Phosphatase: 77 IU/L (ref 44–121)
BUN/Creatinine Ratio: 27 (ref 12–28)
BUN: 24 mg/dL (ref 8–27)
Bilirubin Total: 0.3 mg/dL (ref 0.0–1.2)
CO2: 22 mmol/L (ref 20–29)
Calcium: 9.9 mg/dL (ref 8.7–10.3)
Chloride: 101 mmol/L (ref 96–106)
Creatinine, Ser: 0.9 mg/dL (ref 0.57–1.00)
Globulin, Total: 2.9 g/dL (ref 1.5–4.5)
Glucose: 91 mg/dL (ref 70–99)
Potassium: 3.8 mmol/L (ref 3.5–5.2)
Sodium: 140 mmol/L (ref 134–144)
Total Protein: 7.1 g/dL (ref 6.0–8.5)
eGFR: 68 mL/min/{1.73_m2} (ref >59)

## 2024-01-31 ENCOUNTER — Ambulatory Visit: Payer: Self-pay

## 2024-04-25 ENCOUNTER — Other Ambulatory Visit: Payer: Self-pay

## 2024-04-25 DIAGNOSIS — Z780 Asymptomatic menopausal state: Secondary | ICD-10-CM

## 2024-04-25 DIAGNOSIS — E2839 Other primary ovarian failure: Secondary | ICD-10-CM

## 2024-04-25 DIAGNOSIS — Z1231 Encounter for screening mammogram for malignant neoplasm of breast: Secondary | ICD-10-CM

## 2024-04-27 ENCOUNTER — Ambulatory Visit (INDEPENDENT_AMBULATORY_CARE_PROVIDER_SITE_OTHER)
Admission: RE | Admit: 2024-04-27 | Discharge: 2024-04-27 | Disposition: A | Discharge status: Home / Self Care | Source: Ambulatory Visit

## 2024-04-27 DIAGNOSIS — E2839 Other primary ovarian failure: Secondary | ICD-10-CM

## 2024-04-27 DIAGNOSIS — Z780 Asymptomatic menopausal state: Secondary | ICD-10-CM

## 2024-05-01 ENCOUNTER — Ambulatory Visit: Payer: Self-pay

## 2024-06-07 ENCOUNTER — Ambulatory Visit
Admission: RE | Admit: 2024-06-07 | Discharge: 2024-06-07 | Disposition: A | Discharge status: Home / Self Care | Source: Ambulatory Visit

## 2024-06-07 DIAGNOSIS — Z1231 Encounter for screening mammogram for malignant neoplasm of breast: Secondary | ICD-10-CM

## 2024-06-11 ENCOUNTER — Ambulatory Visit: Payer: Self-pay | Admitting: Family Medicine

## 2024-06-29 ENCOUNTER — Ambulatory Visit: Payer: Self-pay

## 2024-06-29 NOTE — Telephone Encounter (Signed)
 FYI Only or Action Required?: FYI only for provider: appointment scheduled on 06/30/24.  Patient was last seen in primary care on 01/28/2024 by Sirivol, Mamatha, MD.  Called Nurse Triage reporting Dysuria.  Symptoms began several days ago.  Interventions attempted: Nothing.  Symptoms are: stable.  Triage Disposition: See Physician Within 24 Hours  Patient/caregiver understands and will follow disposition?: Yes Reason for Disposition  Urinating more frequently than usual (i.e., frequency) OR new-onset of the feeling of an urgent need to urinate (i.e., urgency)  Answer Assessment - Initial Assessment Questions 1. SYMPTOM: What's the main symptom you're concerned about? (e.g., frequency, incontinence)     Burning with urination  2. ONSET: When did the burning start?     Couple days  3. PAIN: Is there any pain? If Yes, ask: How bad is it? (Scale: 1-10; mild, moderate, severe)     Moderate  4. CAUSE: What do you think is causing the symptoms?     UTI  5. OTHER SYMPTOMS: Do you have any other symptoms? (e.g., blood in urine, fever, flank pain, pain with urination)     Irritable  Protocols used: Urinary Symptoms-A-AH  Copied from CRM #[id number]. Topic: Clinical - Red Word Triage >> Jun 29, 2024  4:42 PM Joesph NOVAK wrote: Red Word that prompted transfer to Nurse Triage: UTI symptoms, burning and pain.

## 2024-06-30 ENCOUNTER — Ambulatory Visit

## 2024-06-30 VITALS — BP 114/60 | HR 91 | Temp 98.0°F | Ht 67.0 in | Wt 179.0 lb

## 2024-06-30 DIAGNOSIS — R300 Dysuria: Secondary | ICD-10-CM

## 2024-06-30 LAB — POCT URINALYSIS DIP (CLINITEK)
Bilirubin, UA: NEGATIVE
Blood, UA: NEGATIVE
Glucose, UA: NEGATIVE mg/dL
Ketones, POC UA: NEGATIVE mg/dL
Leukocytes, UA: NEGATIVE
Nitrite, UA: POSITIVE — AB
POC PROTEIN,UA: NEGATIVE
Spec Grav, UA: 1.02 (ref 1.010–1.025)
Urobilinogen, UA: 0.2 U/dL
pH, UA: 6 (ref 5.0–8.0)

## 2024-06-30 MED ORDER — NITROFURANTOIN MONOHYD MACRO 100 MG PO CAPS: 100.0000 mg | ORAL_CAPSULE | Freq: Two times a day (BID) | ORAL | 0 refills | Status: AC

## 2024-06-30 NOTE — Assessment & Plan Note (Signed)
 Urinary tract infection with dysuria Intermittent symptoms suggestive of a urinary tract infection, including mild discomfort and positive nitrites in urine, indicating infection. No significant frequency or back pain reported. Differential includes bladder irritation versus infection. Urine culture pending to confirm bacterial growth and antibiotic sensitivity. - Prescribed Macrobid (nitrofurantoin) 100 mg, one tablet twice daily for 7 days. - Sent urine for culture to confirm bacterial growth and antibiotic sensitivity. - If urine culture shows no bacterial growth, discontinue antibiotic after 3 days. - If culture shows bacteria not responsive to Macrobid, will change antibiotic. - Advised to maintain hydration, especially during travel. - Instructed to monitor for high fevers or flank pain, which may indicate progression to kidney infection.       Orders:   POCT URINALYSIS DIP (CLINITEK)   Urine Culture

## 2024-06-30 NOTE — Progress Notes (Signed)
 Acute Office Visit  Subjective:    Patient ID: Melanie Guerrero, female    DOB: 04-26-1951, 73 y.o.   MRN: [id number]  Chief Complaint  Patient presents with   Dysuria    HPI: Discussed the use of AI scribe software for clinical note transcription with the patient, who gave verbal consent to proceed.  History of Present Illness   Melanie Guerrero is a 73 year old female who presents with symptoms suggestive of a urinary tract infection.  Lower urinary tract symptoms - Intermittent symptoms resembling urinary tract infection, characterized by strong discomfort rather than pain - Symptoms typically resolve with consumption of cranberry juice - Yesterday afternoon, symptoms intensified while driving - Significant intake of cranberry juice before bed led to partial symptom relief - No urinary frequency, flank pain, or abdominal pain - No fever or phlegm - Mild discomfort persists.  Constitutional symptoms - Mild irritability - Sensation of feeling slightly cold  Musculoskeletal pain - Mild back pain attributed to a pulled muscle on the right side, radiating down to the bottom  Prior urinary tract infection management - Previous episodes treated with Macrobid, possibly once or twice, with good effect  Travel plans - Scheduled to fly to [place]  later today to visit grandchildren           Past Medical History:  Diagnosis Date   Chronic headaches    Hyperlipidemia    Hypertension    Obesity    Thyroid  disease    Hypothyroidism   UTI (urinary tract infection)     Past Surgical History:  Procedure Laterality Date   CHOLECYSTECTOMY     EYE SURGERY  02/2020   cataracts BL.     Family History  Problem Relation Age of Onset   Dementia Mother    Breast cancer Mother 20   Hypothyroidism Sister    Colon cancer Neg Hx    Esophageal cancer Neg Hx     Social History   Socioeconomic History   Marital status: Married    Spouse name: Not on file   Number of  children: Not on file   Years of education: Not on file   Highest education level: Bachelor's degree (e.g., BA, AB, BS)  Occupational History   Not on file  Tobacco Use   Smoking status: Never   Smokeless tobacco: Never  Vaping Use   Vaping status: Never Used  Substance and Sexual Activity   Alcohol use: Yes    Alcohol/week: 3.0 standard drinks of alcohol    Types: 3 Glasses of wine per week   Drug use: Not Currently   Sexual activity: Not on file  Other Topics Concern   Not on file  Social History Narrative   Not on file   Social Drivers of Health   Financial Resource Strain: Low Risk  (10/18/2023)   Overall Financial Resource Strain (CARDIA)    Difficulty of Paying Living Expenses: Not hard at all  Food Insecurity: Low Risk  (02/29/2024)   Received from [facility]   Hunger Vital Sign    Within the past 12 months, you worried that your food would run out before you got money to buy more: Never true    Within the past 12 months, the food you bought just didn't last and you didn't have money to get more. : Never true  Transportation Needs: No Transportation Needs (02/29/2024)   Received from [facility]    In the past 12 months,  has lack of reliable transportation kept you from medical appointments, meetings, work or from getting things needed for daily living? : No  Physical Activity: Insufficiently Active (10/18/2023)   Exercise Vital Sign    Days of Exercise per Week: 2 days    Minutes of Exercise per Session: 30 min  Stress: No Stress Concern Present (10/18/2023)   [employer] of Occupational Health - Occupational Stress Questionnaire    Feeling of Stress : Only a little  Social Connections: Moderately Integrated (10/18/2023)   Social Connection and Isolation Panel    Frequency of Communication with Friends and Family: Once a week    Frequency of Social Gatherings with Friends and Family: Once a week    Attends Religious Services: More than 4 times  per year    Active Member of [employer] or Organizations: Yes    Attends Engineer, Structural: More than 4 times per year    Marital Status: Married  Catering Manager Violence: Not on file    Outpatient Medications Prior to Visit  Medication Sig Dispense Refill   Calcium Carb-Cholecalciferol (CALCIUM 500 + D3 PO) Take 1 capsule by mouth daily.     Cholecalciferol (VITAMIN D3 PO) Take by mouth.     levothyroxine (SYNTHROID) 150 MCG tablet TAKE 1 TABLET BY MOUTH DAILY EXCEPT 1/2 TABLET ON SATURDAY AND SUNDAY     liothyronine (CYTOMEL) 5 MCG tablet Take 1 tablet by mouth daily.     losartan-hydrochlorothiazide (HYZAAR) 50-12.5 MG tablet Take 1 tablet by mouth daily. 90 tablet 1   B Complex Vitamins (VITAMIN B COMPLEX PO) Take by mouth. (Patient not taking: Reported on 06/30/2024)     metFORMIN (GLUCOPHAGE-XR) 500 MG 24 hr tablet Take 500 mg by mouth 2 (two) times daily. (Patient not taking: Reported on 06/30/2024)     Multiple Vitamin (MULTIVITAMIN) tablet Take 1 tablet by mouth daily. (Patient not taking: Reported on 06/30/2024)     No facility-administered medications prior to visit.    No Known Allergies  Review of Systems  Constitutional:  Negative for chills, fatigue and fever.  HENT:  Negative for congestion, ear pain and sinus pain.   Respiratory:  Negative for cough and shortness of breath.   Cardiovascular:  Negative for chest pain.  Gastrointestinal:  Negative for abdominal pain, constipation, diarrhea, nausea and vomiting.  Genitourinary:  Positive for dysuria.  Musculoskeletal:  Negative for myalgias.  Neurological:  Negative for headaches.       Objective:        11 /02/2024    9:36 AM 01/28/2024    8:07 AM 10/18/2023    2:59 PM  Vitals with BMI  Height 5' 7 5' 7   Weight 179 lbs 197 lbs   BMI 28.03 30.85   Systolic 114 120 841  Diastolic 60 80 84  Pulse 91 83     No data found.   Physical Exam Vitals and nursing note reviewed.  Constitutional:       Appearance: Normal appearance.  HENT:     Head: Normocephalic and atraumatic.  Cardiovascular:     Rate and Rhythm: Normal rate and regular rhythm.  Pulmonary:     Effort: Pulmonary effort is normal.     Breath sounds: Normal breath sounds.  Abdominal:     General: There is no distension.     Tenderness: There is no abdominal tenderness. There is no right CVA tenderness or left CVA tenderness.  Musculoskeletal:        General: Normal  range of motion.  Skin:    General: Skin is warm.  Neurological:     General: No focal deficit present.     Mental Status: She is alert.  Psychiatric:        Mood and Affect: Mood normal.     Health Maintenance Due  Topic Date Due   Medicare Annual Wellness (AWV)  Never done    There are no preventive care reminders to display for this patient.   No results found for: TSH Lab Results  Component Value Date   WBC 12.8 (H) 09/13/2020   HGB 14.4 09/13/2020   HCT 42.1 09/13/2020   MCV 94 09/13/2020   PLT 272 09/13/2020   Lab Results  Component Value Date   NA 140 01/28/2024   K 3.8 01/28/2024   CO2 22 01/28/2024   GLUCOSE 91 01/28/2024   BUN 24 01/28/2024   CREATININE 0.90 01/28/2024   BILITOT 0.3 01/28/2024   ALKPHOS 77 01/28/2024   AST 13 01/28/2024   ALT 15 01/28/2024   PROT 7.1 01/28/2024   ALBUMIN 4.2 01/28/2024   CALCIUM 9.9 01/28/2024   EGFR 68 01/28/2024   Lab Results  Component Value Date   CHOL 184 10/26/2023   Lab Results  Component Value Date   HDL 67 10/26/2023   Lab Results  Component Value Date   LDLCALC 105 (H) 10/26/2023   Lab Results  Component Value Date   TRIG 66 10/26/2023   Lab Results  Component Value Date   CHOLHDL 2.7 10/26/2023   No results found for: HGBA1C      Results for orders placed or performed in visit on 06/30/24  POCT URINALYSIS DIP (CLINITEK)   Collection Time: 06/30/24  9:43 AM  Result Value Ref Range   Color, UA yellow yellow   Clarity, UA clear clear   Glucose,  UA negative negative mg/dL   Bilirubin, UA negative negative   Ketones, POC UA negative negative mg/dL   Spec Grav, UA 8.979 8.989 - 1.025   Blood, UA negative negative   pH, UA 6.0 5.0 - 8.0   POC PROTEIN,UA negative negative, trace   Urobilinogen, UA 0.2 0.2 or 1.0 E.U./dL   Nitrite, UA Positive (A) Negative   Leukocytes, UA Negative Negative     Assessment & Plan:   Assessment & Plan Dysuria Urinary tract infection with dysuria Intermittent symptoms suggestive of a urinary tract infection, including mild discomfort and positive nitrites in urine, indicating infection. No significant frequency or back pain reported. Differential includes bladder irritation versus infection. Urine culture pending to confirm bacterial growth and antibiotic sensitivity. - Prescribed Macrobid (nitrofurantoin) 100 mg, one tablet twice daily for 7 days. - Sent urine for culture to confirm bacterial growth and antibiotic sensitivity. - If urine culture shows no bacterial growth, discontinue antibiotic after 3 days. - If culture shows bacteria not responsive to Macrobid, will change antibiotic. - Advised to maintain hydration, especially during travel. - Instructed to monitor for high fevers or flank pain, which may indicate progression to kidney infection.       Orders:   POCT URINALYSIS DIP (CLINITEK)   Urine Culture     Body mass index is 28.04 kg/m.SABRA   No orders of the defined types were placed in this encounter.   Orders Placed This Encounter  Procedures   POCT URINALYSIS DIP (CLINITEK)     Follow-up: No follow-ups on file.  An After Visit Summary was printed and given to the patient.  Bekim Werntz  Annasophia Crocker, MD [facility] [phone]

## 2024-07-05 ENCOUNTER — Ambulatory Visit: Payer: Self-pay

## 2024-07-05 LAB — URINE CULTURE

## 2024-07-29 ENCOUNTER — Other Ambulatory Visit: Payer: Self-pay

## 2024-07-31 ENCOUNTER — Ambulatory Visit

## 2024-07-31 VITALS — BP 132/88 | HR 97 | Temp 97.7°F | Ht 67.0 in | Wt 171.4 lb

## 2024-07-31 DIAGNOSIS — Z13220 Encounter for screening for lipoid disorders: Secondary | ICD-10-CM

## 2024-07-31 DIAGNOSIS — I10 Essential (primary) hypertension: Secondary | ICD-10-CM

## 2024-07-31 DIAGNOSIS — Z0000 Encounter for general adult medical examination without abnormal findings: Secondary | ICD-10-CM | POA: Diagnosis not present

## 2024-07-31 DIAGNOSIS — E039 Hypothyroidism, unspecified: Secondary | ICD-10-CM

## 2024-07-31 DIAGNOSIS — R300 Dysuria: Secondary | ICD-10-CM | POA: Diagnosis not present

## 2024-07-31 DIAGNOSIS — M8580 Other specified disorders of bone density and structure, unspecified site: Secondary | ICD-10-CM | POA: Diagnosis not present

## 2024-07-31 LAB — CBC WITH DIFFERENTIAL/PLATELET
Basophils Absolute: 0.1 x10E3/uL (ref 0.0–0.2)
Basos: 1 %
EOS (ABSOLUTE): 0.2 x10E3/uL (ref 0.0–0.4)
Eos: 2 %
Hematocrit: 44.3 % (ref 34.0–46.6)
Hemoglobin: 14.8 g/dL (ref 11.1–15.9)
Immature Grans (Abs): 0 x10E3/uL (ref 0.0–0.1)
Immature Granulocytes: 0 %
Lymphocytes Absolute: 4.2 x10E3/uL — ABNORMAL HIGH (ref 0.7–3.1)
Lymphs: 50 %
MCH: 32.1 pg (ref 26.6–33.0)
MCHC: 33.4 g/dL (ref 31.5–35.7)
MCV: 96 fL (ref 79–97)
Monocytes Absolute: 0.5 x10E3/uL (ref 0.1–0.9)
Monocytes: 6 %
Neutrophils Absolute: 3.5 x10E3/uL (ref 1.4–7.0)
Neutrophils: 41 %
Platelets: 263 x10E3/uL (ref 150–450)
RBC: 4.61 x10E6/uL (ref 3.77–5.28)
RDW: 12.5 % (ref 11.7–15.4)
WBC: 8.4 x10E3/uL (ref 3.4–10.8)

## 2024-07-31 LAB — LIPID PANEL
Chol/HDL Ratio: 3.3 ratio (ref 0.0–4.4)
Cholesterol, Total: 175 mg/dL (ref 100–199)
HDL: 53 mg/dL (ref >39)
LDL Chol Calc (NIH): 106 mg/dL — ABNORMAL HIGH (ref 0–99)
Triglycerides: 87 mg/dL (ref 0–149)
VLDL Cholesterol Cal: 16 mg/dL (ref 5–40)

## 2024-07-31 LAB — POCT URINALYSIS DIP (CLINITEK)
Blood, UA: NEGATIVE
Glucose, UA: NEGATIVE mg/dL
Ketones, POC UA: NEGATIVE mg/dL
Nitrite, UA: NEGATIVE
Spec Grav, UA: 1.02 (ref 1.010–1.025)
Urobilinogen, UA: 0.2 U/dL
pH, UA: 6 (ref 5.0–8.0)

## 2024-07-31 LAB — COMPREHENSIVE METABOLIC PANEL WITH GFR
ALT: 17 IU/L (ref 0–32)
AST: 12 IU/L (ref 0–40)
Albumin: 4.1 g/dL (ref 3.8–4.8)
Alkaline Phosphatase: 70 IU/L (ref 49–135)
BUN/Creatinine Ratio: 15 (ref 12–28)
BUN: 16 mg/dL (ref 8–27)
Bilirubin Total: 0.4 mg/dL (ref 0.0–1.2)
CO2: 26 mmol/L (ref 20–29)
Calcium: 9.5 mg/dL (ref 8.7–10.3)
Chloride: 103 mmol/L (ref 96–106)
Creatinine, Ser: 1.07 mg/dL — ABNORMAL HIGH (ref 0.57–1.00)
Globulin, Total: 2.7 g/dL (ref 1.5–4.5)
Glucose: 85 mg/dL (ref 70–99)
Potassium: 4.9 mmol/L (ref 3.5–5.2)
Sodium: 141 mmol/L (ref 134–144)
Total Protein: 6.8 g/dL (ref 6.0–8.5)
eGFR: 55 mL/min/1.73 — ABNORMAL LOW (ref >59)

## 2024-07-31 LAB — TSH: TSH: 0.729 u[IU]/mL (ref 0.450–4.500)

## 2024-07-31 MED ORDER — CEPHALEXIN 500 MG PO CAPS: 500.0000 mg | ORAL_CAPSULE | Freq: Three times a day (TID) | ORAL | 0 refills | Status: AC

## 2024-07-31 NOTE — Patient Instructions (Addendum)
 [name],  Thank you for taking the time for your Medicare Wellness Visit. I appreciate your continued commitment to your health goals. Please review the care plan we discussed, and feel free to reach out if I can assist you further.  Please note that Annual Wellness Visits do not include a physical exam. Some assessments may be limited, especially if the visit was conducted virtually. If needed, we may recommend an in-person follow-up with your provider.  Ongoing Care Seeing your primary care provider every 3 to 6 months helps us  monitor your health and provide consistent, personalized care.   Referrals If a referral was made during today's visit and you haven't received any updates within two weeks, please contact the referred provider directly to check on the status.  Recommended Screenings:  Health Maintenance  Topic Date Due   COVID-19 Vaccine (6 - 2025-26 season) 04/24/2024   Flu Shot  11/21/2024*   Hepatitis C Screening  06/30/2025*   DTaP/Tdap/Td vaccine (2 - Td or Tdap) 12/09/2024   Breast Cancer Screening  06/07/2025   Medicare Annual Wellness Visit  07/31/2025   Colon Cancer Screening  11/13/2027   Pneumococcal Vaccine for age over 109  Completed   Osteoporosis screening with Bone Density Scan  Completed   Zoster (Shingles) Vaccine  Completed   Meningitis B Vaccine  Aged Out  *Topic was postponed. The date shown is not the original due date.       07/31/2024    8:49 AM  Advanced Directives  Does Patient Have a Medical Advance Directive? Yes  Type of Estate Agent of [place];Living will;Out of facility DNR (pink MOST or yellow form)  Copy of Healthcare Power of Attorney in Chart? No - copy requested    Vision: Annual vision screenings are recommended for early detection of glaucoma, cataracts, and diabetic retinopathy. These exams can also reveal signs of chronic conditions such as diabetes and high blood pressure.  Dental: Annual dental  screenings help detect early signs of oral cancer, gum disease, and other conditions linked to overall health, including heart disease and diabetes.  Please see the attached documents for additional preventive care recommendations.    VISIT SUMMARY: Today, we addressed your urinary discomfort and recent upper respiratory illness. We also reviewed your ongoing health conditions, including hypothyroidism, hypertension, and osteopenia, and discussed your current medications and preventive care measures.  YOUR PLAN: URINARY DISCOMFORT: You have been experiencing intermittent discomfort in your lower urinary tract without typical symptoms of a urinary infection. -We collected a urine sample for analysis. -Increase your fluid intake. -You can use over-the-counter phenazopyridine (Azo) for symptom relief after the urine sample is collected. -We will wait for the urine culture results before starting any antibiotics.  RECENT UPPER RESPIRATORY ILLNESS: You recently had an upper respiratory illness with symptoms like malaise, cough, and body aches, but no fever. -Continue to rest and stay hydrated. -Monitor your symptoms and let us  know if they worsen.  ACQUIRED HYPOTHYROIDISM: Your thyroid  condition is managed with levothyroxine and liothyronine. -Continue taking your thyroid  medications as prescribed. -Keep up with your regular follow-ups with your endocrinologist.  BENIGN ESSENTIAL HYPERTENSION: Your blood pressure is well-controlled with your current medication. -Continue taking your blood pressure medications as prescribed.  OSTEOPENIA: You have osteopenia, which is managed with calcium and vitamin D supplements. -Continue taking your calcium and vitamin D supplements. -Incorporate strength training exercises 2-3 times a week to improve bone density.  GENERAL HEALTH MAINTENANCE: Routine wellness visit with no  falls, good cognitive function, and up-to-date vaccinations. -Continue with your  current health maintenance and screenings as scheduled. -Ensure you have anti-skid mats or strips in the shower for safety.                      Contains text generated by [employer]bridge.                                 Contains text generated by Abridge.

## 2024-07-31 NOTE — Assessment & Plan Note (Signed)
 Osteopenia Confirmed by recent DEXA scan with T-scores of -1.9. Managed with calcium and vitamin D supplementation. - Continue calcium and vitamin D supplementation. - Encouraged strength training exercises to improve bone density.

## 2024-07-31 NOTE — Assessment & Plan Note (Signed)
 Benign essential hypertension Blood pressure is well-controlled on current medication regimen of Losartan  50- hydrochlorothiazide 12.5 mg - Continue current antihypertensive medications.

## 2024-07-31 NOTE — Assessment & Plan Note (Signed)
 Dysuria and possible acute cystitis Intermittent lower urinary tract discomfort without typical dysuria. Symptoms began this morning. Differential includes bladder irritation versus infection. Recent antibiotic use for UTI. Discussed potential for bladder irritation due to dehydration or dry vaginal mucosa. Advised against immediate antibiotic use to prevent resistance. - Obtained urine sample for analysis.URINE SAMPLE SHOWED 1+ BILI, SLIGHTLY INCREASED SPECIFIC GRAVITY, PROTEIN, AND 2+ LEUKOCYTE ESTERASE.  - Encouraged increased fluid intake. - Advised use of over-the-counter phenazopyridine (Azo) for symptom relief after urine sample collection. - Await urine culture results before initiating antibiotic therapy.  - WILL SEND KEFLEX  500 MG TID FOR 7 DAYS BASED ON HER RECENT CULTURE RESULTS SHOWING E.COLI MORE SENSITIVE TO CEPHALOSPORINS. Melanie Guerrero WILL ONLY START THE ANTIBIOTIC AFTER WE GET THE CULTURE RESULTS OR IF SHE FEELS WORSE OVER THE NEXT 1-2 DAYS.

## 2024-07-31 NOTE — Assessment & Plan Note (Signed)
 Adult Wellness Visit Routine wellness visit with no falls, good cognitive function, and up-to-date vaccinations. Recent DEXA scan shows osteopenia with T-scores of -1.9. Regular eye and dental exams are maintained. Thyroid  function is monitored by endocrinologist. - Continue current health maintenance and screenings as scheduled. - Encouraged strength training exercises 2-3 times a week. - Ensured anti-skid mats or strips in the shower for safety.

## 2024-07-31 NOTE — Progress Notes (Signed)
 Chief Complaint  Patient presents with   Annual Exam     Subjective:   Melanie Guerrero is a 73 y.o. female who presents for a Medicare Annual Wellness Visit AND A FOLLOW UP OF CHRONIC MEDICAL PROBLEMS.   Visit info / Clinical Intake: Medicare Wellness Visit Type:: Subsequent Annual Wellness Visit Persons participating in visit and providing information:: patient Medicare Wellness Visit Mode:: In-person (required for WTM) Interpreter Needed?: No Pre-visit prep was completed: no AWV questionnaire completed by patient prior to visit?: no Living arrangements:: lives with spouse/significant other Patient's Overall Health Status Rating: good Typical amount of pain: none Does pain affect daily life?: no Are you currently prescribed opioids?: no  Dietary Habits and Nutritional Risks How many meals a day?: 2 Eats fruit and vegetables daily?: yes Most meals are obtained by: preparing own meals; eating out; having others provide food In the last 2 weeks, have you had any of the following?: none Diabetic:: no  Functional Status Activities of Daily Living (to include ambulation/medication): Independent Ambulation: Independent Medication Administration: Independent Home Management (perform basic housework or laundry): Independent Manage your own finances?: yes Primary transportation is: driving Concerns about vision?: no *vision screening is required for WTM* Concerns about hearing?: no  Fall Screening Falls in the past year?: 0 Number of falls in past year: 0 Was there an injury with Fall?: 0 Fall Risk Category Calculator: 0 Patient Fall Risk Level: Low Fall Risk  Fall Risk Patient at Risk for Falls Due to: No Fall Risks Fall risk Follow up: Falls evaluation completed  Home and Transportation Safety: All rugs have non-skid backing?: yes All stairs or steps have railings?: yes Grab bars in the bathtub or shower?: (!) no Have non-skid surface in bathtub or shower?: (!)  no Good home lighting?: yes Regular seat belt use?: yes Hospital stays in the last year:: no  Cognitive Assessment Difficulty concentrating, remembering, or making decisions? : no Will 6CIT or Mini Cog be Completed: yes What year is it?: 0 points What month is it?: 0 points Give patient an address phrase to remember (5 components): [address] [place] About what time is it?: 0 points Count backwards from 20 to 1: 0 points Say the months of the year in reverse: 0 points Repeat the address phrase from earlier: 0 points 6 CIT Score: 0 points  Advance Directives (For Healthcare) Does Patient Have a Medical Advance Directive?: Yes Type of Advance Directive: Healthcare Power of [place]; Living will; Out of facility DNR (pink MOST or yellow form) Copy of Healthcare Power of Attorney in Chart?: No - copy requested Copy of Living Will in Chart?: No - copy requested Out of facility DNR (pink MOST or yellow form) in Chart? (Ambulatory ONLY): No - copy requested  Reviewed/Updated  Reviewed/Updated: Reviewed All (Medical, Surgical, Family, Medications, Allergies, Care Teams, Patient Goals)   Discussed the use of AI scribe software for clinical note transcription with the patient, who gave verbal consent to proceed.  History of Present Illness    Urinary discomfort - Onset this morning - Uncomfortable sensation in the lower urinary canal - No typical burning or pain with urination - Symptoms are intermittent, with some days better than others - Completed a course of Macrobid  for urinary infection about one month ago - No fever, dysuria, or significant urinary frequency  Recent upper respiratory illness - Recent illness after returning from [place] , shared with husband - Symptoms included malaise, cough, and body aches - No fever - COVID-19  test negative - Illness lasted approximately two weeks - Stayed home for 2.5 to 3 weeks to recover - Persistent nagging cough and malaise -  No deep cough or significant respiratory distress  Medication use - Levothyroxine taken daily with specific weekend dosing schedule - Liothyronine also taken for thyroid  management - Losartan /hydrochlorothiazide 50/12.5 mg for blood pressure, with well-controlled blood pressure - Metformin not taken for the past three months (previously used for weight loss and longevity)  Preventive care and lifestyle - Received influenza vaccination about one and a half weeks before trip to [place]  - Maintains a balanced diet and regular walking routine, but has not walked recently due to illness  Osteopenia and supplement use - History of osteopenia with T-score of -1.9 - Takes calcium with vitamin D supplements  Functional status - Independent in activities of daily living - No history of falls - Lives with husband      Allergies (verified) Patient has no known allergies.   Current Medications (verified) Outpatient Encounter Medications as of 07/31/2024  Medication Sig   B Complex Vitamins (VITAMIN B COMPLEX PO) Take by mouth.   Calcium Carb-Cholecalciferol (CALCIUM 500 + D3 PO) Take 1 capsule by mouth daily.   cephALEXin  (KEFLEX ) 500 MG capsule Take 1 capsule (500 mg total) by mouth 3 (three) times daily for 7 days.   Cholecalciferol (VITAMIN D3 PO) Take by mouth.   levothyroxine (SYNTHROID) 150 MCG tablet TAKE 1 TABLET BY MOUTH DAILY EXCEPT 1/2 TABLET ON SATURDAY AND SUNDAY   liothyronine (CYTOMEL) 5 MCG tablet Take 1 tablet by mouth daily.   losartan -hydrochlorothiazide (HYZAAR) 50-12.5 MG tablet TAKE 1 TABLET BY MOUTH EVERY DAY   metFORMIN (GLUCOPHAGE-XR) 500 MG 24 hr tablet Take 500 mg by mouth 2 (two) times daily. (Patient not taking: Reported on 07/31/2024)   Multiple Vitamin (MULTIVITAMIN) tablet Take 1 tablet by mouth daily. (Patient not taking: Reported on 07/31/2024)   No facility-administered encounter medications on file as of 07/31/2024.    History: Past Medical History:   Diagnosis Date   Chronic headaches    Hyperlipidemia    Hypertension    Obesity    Thyroid  disease    Hypothyroidism   UTI (urinary tract infection)    Past Surgical History:  Procedure Laterality Date   CHOLECYSTECTOMY     EYE SURGERY  02/2020   cataracts BL.    Family History  Problem Relation Age of Onset   Dementia Mother    Breast cancer Mother 62   Hypothyroidism Sister    Colon cancer Neg Hx    Esophageal cancer Neg Hx    Social History   Occupational History   Not on file  Tobacco Use   Smoking status: Never   Smokeless tobacco: Never  Vaping Use   Vaping status: Never Used  Substance and Sexual Activity   Alcohol use: Not Currently    Alcohol/week: 3.0 standard drinks of alcohol   Drug use: Never   Sexual activity: Yes    Birth control/protection: Post-menopausal   Tobacco Counseling Counseling given: Not Answered  SDOH Screenings   Food Insecurity: No Food Insecurity (07/31/2024)  Housing: Unknown (07/31/2024)  Transportation Needs: No Transportation Needs (07/31/2024)  Utilities: Not At Risk (07/31/2024)  Alcohol Screen: Low Risk  (10/18/2023)  Depression (PHQ2-9): Low Risk  (07/31/2024)  Financial Resource Strain: Low Risk  (07/27/2024)  Physical Activity: Insufficiently Active (07/31/2024)  Social Connections: Socially Integrated (07/31/2024)  Stress: No Stress Concern Present (07/31/2024)  Tobacco Use: Low Risk  (  07/31/2024)  Health Literacy: Adequate Health Literacy (07/31/2024)   See flowsheets for full screening details  Depression Screen PHQ 2 & 9 Depression Scale- Over the past 2 weeks, how often have you been bothered by any of the following problems? Little interest or pleasure in doing things: 0 Feeling down, depressed, or hopeless (PHQ Adolescent also includes...irritable): 0 PHQ-2 Total Score: 0 Trouble falling or staying asleep, or sleeping too much: 1 Feeling tired or having little energy: 1 Poor appetite or overeating (PHQ Adolescent  also includes...weight loss): 0 Feeling bad about yourself - or that you are a failure or have let yourself or your family down: 0 Trouble concentrating on things, such as reading the newspaper or watching television (PHQ Adolescent also includes...like school work): 0 Moving or speaking so slowly that other people could have noticed. Or the opposite - being so fidgety or restless that you have been moving around a lot more than usual: 0 Thoughts that you would be better off dead, or of hurting yourself in some way: 0 PHQ-9 Total Score: 2 If you checked off any problems, how difficult have these problems made it for you to do your work, take care of things at home, or get along with other people?: Not difficult at all  Depression Treatment Depression Interventions/Treatment : EYV7-0 Score <4 Follow-up Not Indicated     Goals Addressed   None     Review of Systems  Constitutional: Negative.   HENT: Negative.    Eyes: Negative.   Respiratory: Negative.    Gastrointestinal: Negative.   Genitourinary:  Positive for dysuria.  Musculoskeletal: Negative.   Psychiatric/Behavioral: Negative.      Physical Exam Vitals and nursing note reviewed.  Constitutional:      Appearance: Normal appearance.  HENT:     Head: Normocephalic and atraumatic.  Cardiovascular:     Rate and Rhythm: Normal rate and regular rhythm.  Pulmonary:     Effort: Pulmonary effort is normal.     Breath sounds: Normal breath sounds.  Abdominal:     Tenderness: There is no abdominal tenderness. There is no right CVA tenderness or left CVA tenderness.  Musculoskeletal:        General: Normal range of motion.  Neurological:     Mental Status: She is alert.        Objective:    Today's Vitals   07/31/24 0845  BP: 132/88  Pulse: 97  Temp: 97.7 F (36.5 C)  SpO2: 97%  Weight: 171 lb 6.4 oz (77.7 kg)  Height: 5' 7 (1.702 m)   Body mass index is 26.85 kg/m.  Hearing/Vision screen No results  found. Immunizations and Health Maintenance Health Maintenance  Topic Date Due   COVID-19 Vaccine (6 - 2025-26 season) 04/24/2024   Influenza Vaccine  11/21/2024 (Originally 03/24/2024)   Hepatitis C Screening  06/30/2025 (Originally 04/23/1969)   DTaP/Tdap/Td (2 - Td or Tdap) 12/09/2024   Mammogram  06/07/2025   Medicare Annual Wellness (AWV)  07/31/2025   Colonoscopy  11/13/2027   Pneumococcal Vaccine: 50+ Years  Completed   Bone Density Scan  Completed   Zoster Vaccines- Shingrix  Completed   Meningococcal B Vaccine  Aged Out        Assessment/Plan:  This is a routine wellness examination for Jennine.  Patient Care Team: Bernis Stecher, MD as PCP - General (Family Medicine)    Encounter for Medicare annual wellness exam Assessment & Plan: Adult Wellness Visit Routine wellness visit with no falls, good  cognitive function, and up-to-date vaccinations. Recent DEXA scan shows osteopenia with T-scores of -1.9. Regular eye and dental exams are maintained. Thyroid  function is monitored by endocrinologist. - Continue current health maintenance and screenings as scheduled. - Encouraged strength training exercises 2-3 times a week. - Ensured anti-skid mats or strips in the shower for safety.  Orders: -     CBC with Differential/Platelet -     Comprehensive metabolic panel with GFR -     Lipid panel -     TSH  Screening for hyperlipidemia -     Lipid panel  Acquired hypothyroidism Assessment & Plan: Acquired hypothyroidism Managed with levothyroxine and liothyronine. Regular follow-up with endocrinologist. - Continue current thyroid  medication regimen. - Continue regular follow-up with endocrinologist.  Orders: -     TSH  Benign essential hypertension Assessment & Plan: Benign essential hypertension Blood pressure is well-controlled on current medication regimen of Losartan  50- hydrochlorothiazide 12.5 mg - Continue current antihypertensive medications.  Orders: -      CBC with Differential/Platelet -     Comprehensive metabolic panel with GFR  Dysuria Assessment & Plan: Dysuria and possible acute cystitis Intermittent lower urinary tract discomfort without typical dysuria. Symptoms began this morning. Differential includes bladder irritation versus infection. Recent antibiotic use for UTI. Discussed potential for bladder irritation due to dehydration or dry vaginal mucosa. Advised against immediate antibiotic use to prevent resistance. - Obtained urine sample for analysis.URINE SAMPLE SHOWED 1+ BILI, SLIGHTLY INCREASED SPECIFIC GRAVITY, PROTEIN, AND 2+ LEUKOCYTE ESTERASE.  - Encouraged increased fluid intake. - Advised use of over-the-counter phenazopyridine (Azo) for symptom relief after urine sample collection. - Await urine culture results before initiating antibiotic therapy.  - WILL SEND KEFLEX  500 MG TID FOR 7 DAYS BASED ON HER RECENT CULTURE RESULTS SHOWING E.COLI MORE SENSITIVE TO CEPHALOSPORINS. Jahni WILL ONLY START THE ANTIBIOTIC AFTER WE GET THE CULTURE RESULTS OR IF SHE FEELS WORSE OVER THE NEXT 1-2 DAYS.  Orders: -     POCT URINALYSIS DIP (CLINITEK)  Osteopenia, unspecified location Assessment & Plan: Osteopenia Confirmed by recent DEXA scan with T-scores of -1.9. Managed with calcium and vitamin D supplementation. - Continue calcium and vitamin D supplementation. - Encouraged strength training exercises to improve bone density.       Other orders -     Cephalexin ; Take 1 capsule (500 mg total) by mouth 3 (three) times daily for 7 days.  Dispense: 21 capsule; Refill: 0      I have personally reviewed and noted the following in the patient's chart:   Medical and social history Use of alcohol, tobacco or illicit drugs  Current medications and supplements including opioid prescriptions. Functional ability and status Nutritional status Physical activity Advanced directives List of other physicians Hospitalizations, surgeries, and  ER visits in previous 12 months Vitals Screenings to include cognitive, depression, and falls Referrals and appointments  Orders Placed This Encounter  Procedures   CBC with Differential   Comprehensive metabolic panel with GFR   Lipid Panel   TSH   POCT URINALYSIS DIP (CLINITEK)   In addition, I have reviewed and discussed with patient certain preventive protocols, quality metrics, and best practice recommendations. A written personalized care plan for preventive services as well as general preventive health recommendations were provided to patient.   Raeleigh Guinn, MD   07/31/2024   Return in 1 year (on 07/31/2025).  After Visit Summary: (In Person-Declined) Patient declined AVS at this time.  Nurse Notes:

## 2024-07-31 NOTE — Addendum Note (Signed)
 Addended by: TWILLA DODRILL C on: 07/31/2024 10:52 AM   Modules accepted: Orders

## 2024-07-31 NOTE — Assessment & Plan Note (Signed)
 Acquired hypothyroidism Managed with levothyroxine and liothyronine. Regular follow-up with endocrinologist. - Continue current thyroid  medication regimen. - Continue regular follow-up with endocrinologist.

## 2024-08-01 ENCOUNTER — Ambulatory Visit: Payer: Self-pay

## 2024-08-03 DIAGNOSIS — N390 Urinary tract infection, site not specified: Secondary | ICD-10-CM

## 2024-08-03 DIAGNOSIS — N3289 Other specified disorders of bladder: Secondary | ICD-10-CM

## 2024-08-03 LAB — URINE CULTURE

## 2024-08-04 ENCOUNTER — Ambulatory Visit

## 2024-08-04 DIAGNOSIS — Z23 Encounter for immunization: Secondary | ICD-10-CM

## 2024-08-04 NOTE — Progress Notes (Unsigned)
 Patient is in office today for a nurse visit for Immunization. Covid vaccine. Patient Injection was given in the  Left deltoid. Patient tolerated injection well.

## 2024-08-31 ENCOUNTER — Ambulatory Visit (INDEPENDENT_AMBULATORY_CARE_PROVIDER_SITE_OTHER)

## 2024-08-31 ENCOUNTER — Ambulatory Visit

## 2024-08-31 DIAGNOSIS — R300 Dysuria: Secondary | ICD-10-CM

## 2024-08-31 LAB — POCT URINALYSIS DIP (CLINITEK)
Bilirubin, UA: NEGATIVE
Blood, UA: NEGATIVE
Glucose, UA: NEGATIVE mg/dL
Ketones, POC UA: NEGATIVE mg/dL
Leukocytes, UA: NEGATIVE
Nitrite, UA: NEGATIVE
POC PROTEIN,UA: NEGATIVE
Spec Grav, UA: <=1.005 — AB
Urobilinogen, UA: 0.2 U/dL
pH, UA: 6

## 2024-08-31 NOTE — Progress Notes (Signed)
 Patient is in office today for a nurse visit for Repeat UA. Patient UA was  negative for all components.  Spoke with Dr. Sirivol, she stated that everything is negative, to try drinking more water and monitor sxs.   Spoke with patient, verbalized understanding and had no questions at this time. Patient would like to get the referral ordered if possible as she has been dealing with this on and off for months.

## 2025-01-29 ENCOUNTER — Ambulatory Visit
